# Patient Record
Sex: Male | Born: 1990 | Race: Black or African American | Hispanic: No | Marital: Single | Smoking: Never smoker
Health system: Southern US, Community
[De-identification: ages and names within clinical notes are randomized; demographics above are authoritative.]

## PROBLEM LIST (undated history)

## (undated) DIAGNOSIS — T7840XA Allergy, unspecified, initial encounter: Secondary | ICD-10-CM

## (undated) DIAGNOSIS — E119 Type 2 diabetes mellitus without complications: Secondary | ICD-10-CM

## (undated) HISTORY — DX: Allergy, unspecified, initial encounter: T78.40XA

## (undated) HISTORY — DX: Type 2 diabetes mellitus without complications: E11.9

---

## 2013-10-04 ENCOUNTER — Encounter (HOSPITAL_COMMUNITY): Payer: Self-pay | Admitting: Emergency Medicine

## 2013-10-04 ENCOUNTER — Emergency Department (HOSPITAL_COMMUNITY)
Admission: EM | Admit: 2013-10-04 | Discharge: 2013-10-04 | Disposition: A | Discharge status: Home / Self Care | Payer: Self-pay | Attending: Emergency Medicine | Admitting: Emergency Medicine

## 2013-10-04 ENCOUNTER — Emergency Department (HOSPITAL_COMMUNITY): Payer: Self-pay

## 2013-10-04 DIAGNOSIS — R071 Chest pain on breathing: Secondary | ICD-10-CM | POA: Insufficient documentation

## 2013-10-04 DIAGNOSIS — R059 Cough, unspecified: Secondary | ICD-10-CM | POA: Insufficient documentation

## 2013-10-04 DIAGNOSIS — R05 Cough: Secondary | ICD-10-CM | POA: Insufficient documentation

## 2013-10-04 DIAGNOSIS — J0110 Acute frontal sinusitis, unspecified: Secondary | ICD-10-CM | POA: Insufficient documentation

## 2013-10-04 DIAGNOSIS — E669 Obesity, unspecified: Secondary | ICD-10-CM | POA: Insufficient documentation

## 2013-10-04 DIAGNOSIS — J069 Acute upper respiratory infection, unspecified: Secondary | ICD-10-CM | POA: Insufficient documentation

## 2013-10-04 MED ORDER — AZITHROMYCIN 250 MG PO TABS: 250.0000 mg | ORAL_TABLET | Freq: Every day | ORAL | Status: DC

## 2013-10-04 MED ORDER — HYDROCODONE-HOMATROPINE 5-1.5 MG/5ML PO SYRP: 5.0000 mL | ORAL_SOLUTION | Freq: Four times a day (QID) | ORAL | Status: DC | PRN

## 2013-10-04 NOTE — ED Provider Notes (Signed)
CSN: [id number]     Arrival date & time 10/04/13  2232 History  This chart was scribed for non-physician practitioner, Santiago Glad, PA-C,working with Toy Cookey, MD, by Karle Plumber, ED Scribe. This patient was seen in room TR07C/TR07C and the patient's care was started at 11:27 PM.  Chief Complaint  Patient presents with  . Cough   Patient is a 23 y.o. male presenting with cough. The history is provided by the patient. No language interpreter was used.  Cough Associated symptoms: chest pain (chest wall pain secondary to cough)   Associated symptoms: no chills, no ear pain, no fever and no headaches    HPI Comments:  Joe Perez is a 23 y.o. obese male who presents to the Emergency Department complaining of a worsening cough over the past three months. Pt states he has had the cough intermittently for one year but states it is now stronger. He denies hemoptysis.  He reports associated chest wall pain secondary to the cough. He only has chest pain with repetitive coughing.  He states the cough is sometimes productive and sometime just a dry cough. He reports associated sinus pain and pressure and congestion. Pt reports taking Alka Seltzer, various OTC cough medications and various OTC allergy medications with no significant relief of the symptoms. There are no worsening factors reported. He denies fever, chills, HA or otalgia.  History reviewed. No pertinent past medical history. History reviewed. No pertinent past surgical history. No family history on file. History  Substance Use Topics  . Smoking status: Never Smoker   . Smokeless tobacco: Not on file  . Alcohol Use: No    Review of Systems  Constitutional: Negative for fever and chills.  HENT: Positive for congestion and sinus pressure. Negative for ear pain.   Respiratory: Positive for cough.   Cardiovascular: Positive for chest pain (chest wall pain secondary to cough).  Neurological: Negative for headaches.     Allergies  Review of patient's allergies indicates no known allergies.  Home Medications   Prior to Admission medications   Not on File   Triage Vitals: BP 132/65  Pulse 80  Temp(Src) 98.6 F (37 C)  Resp 18  Ht  (1.803 m)  Wt 275 lb (124.739 kg)  BMI 38.37 kg/m2  SpO2 97% Physical Exam  Nursing note and vitals reviewed. Constitutional: He is oriented to person, place, and time. He appears well-developed and well-nourished.  HENT:  Head: Normocephalic and atraumatic.  Nose: Right sinus exhibits frontal sinus tenderness. Right sinus exhibits no maxillary sinus tenderness. Left sinus exhibits frontal sinus tenderness. Left sinus exhibits no maxillary sinus tenderness.  Mouth/Throat: Uvula is midline, oropharynx is clear and moist and mucous membranes are normal. No oropharyngeal exudate, posterior oropharyngeal edema or posterior oropharyngeal erythema.  Eyes: EOM are normal.  Neck: Normal range of motion.  Cardiovascular: Normal rate, regular rhythm and normal heart sounds.  Exam reveals no gallop and no friction rub.   No murmur heard. Pulmonary/Chest: Effort normal and breath sounds normal. No respiratory distress. He has no wheezes. He has no rales.  Musculoskeletal: Normal range of motion.  Neurological: He is alert and oriented to person, place, and time.  Skin: Skin is warm and dry.  Psychiatric: He has a normal mood and affect. His behavior is normal.    ED Course  Procedures (including critical care time) DIAGNOSTIC STUDIES: Oxygen Saturation is 97% on RA, normal by my interpretation.   COORDINATION OF CARE: 11:32 PM- Will wait for CXR  to result. Pt verbalizes understanding and agrees to plan.  Medications - No data to display  Labs Review Labs Reviewed - No data to display  Imaging Review Dg Chest 2 View  10/04/2013   CLINICAL DATA:  Cough, congestion, shortness of breath, and chest pain and tightness.  EXAM: CHEST  2 VIEW  COMPARISON:  None.   FINDINGS: The lungs are well-aerated and clear. There is no evidence of focal opacification, pleural effusion or pneumothorax.  The heart is normal in size; the mediastinal contour is within normal limits. No acute osseous abnormalities are seen.  IMPRESSION: No acute cardiopulmonary process seen.   Electronically Signed   By: Roanna Raider M.D.   On: 10/04/2013 23:39     EKG Interpretation None      MDM   Final diagnoses:  None   Patient presenting with a cough x 3 months and also sinus pressure over the past 2-3 weeks.  Patient afebrile.  Non toxic appearing.  Lungs CTAB.  Pulse ox 97 on RA.  CXR negative.  Patient discharged home with antibiotic to treat for Acute Sinusitis.  Patient also given Rx for cough suppressant and instructed to use decongestant.  Patient stable for discharge.  Return precautions given.  I personally performed the services described in this documentation, which was scribed in my presence. The recorded information has been reviewed and is accurate.   Santiago Glad, PA-C 10/05/13 0127

## 2013-10-04 NOTE — Discharge Instructions (Signed)

## 2013-10-04 NOTE — ED Notes (Signed)
Chest congestion hurts when he coughs

## 2013-10-04 NOTE — ED Notes (Signed)
The pt has had a cough for one year intermittently.  For the past 3 months it has been worse.  He sounds as if he has a cold now.  No distress

## 2013-10-05 NOTE — ED Provider Notes (Signed)
Medical screening examination/treatment/procedure(s) were performed by non-physician practitioner and as supervising physician I was immediately available for consultation/collaboration.  Toy Cookey, MD 10/05/13 1009

## 2014-06-01 ENCOUNTER — Emergency Department (HOSPITAL_BASED_OUTPATIENT_CLINIC_OR_DEPARTMENT_OTHER)
Admission: EM | Admit: 2014-06-01 | Discharge: 2014-06-01 | Disposition: A | Discharge status: Home / Self Care | Payer: Self-pay | Attending: Emergency Medicine | Admitting: Emergency Medicine

## 2014-06-01 ENCOUNTER — Encounter (HOSPITAL_BASED_OUTPATIENT_CLINIC_OR_DEPARTMENT_OTHER): Payer: Self-pay | Admitting: Emergency Medicine

## 2014-06-01 DIAGNOSIS — R197 Diarrhea, unspecified: Secondary | ICD-10-CM | POA: Insufficient documentation

## 2014-06-01 DIAGNOSIS — R109 Unspecified abdominal pain: Secondary | ICD-10-CM

## 2014-06-01 DIAGNOSIS — R1013 Epigastric pain: Secondary | ICD-10-CM | POA: Insufficient documentation

## 2014-06-01 LAB — CBC WITH DIFFERENTIAL/PLATELET
Basophils Absolute: 0 10*3/uL (ref 0.0–0.1)
Basophils Relative: 0 % (ref 0–1)
Eosinophils Absolute: 0.2 10*3/uL (ref 0.0–0.7)
Eosinophils Relative: 2 % (ref 0–5)
HCT: 40.5 % (ref 39.0–52.0)
Hemoglobin: 13.4 g/dL (ref 13.0–17.0)
Lymphocytes Relative: 21 % (ref 12–46)
Lymphs Abs: 1.7 10*3/uL (ref 0.7–4.0)
MCH: 27.6 pg (ref 26.0–34.0)
MCHC: 33.1 g/dL (ref 30.0–36.0)
MCV: 83.5 fL (ref 78.0–100.0)
Monocytes Absolute: 1.6 10*3/uL — ABNORMAL HIGH (ref 0.1–1.0)
Monocytes Relative: 19 % — ABNORMAL HIGH (ref 3–12)
Neutro Abs: 4.7 10*3/uL (ref 1.7–7.7)
Neutrophils Relative %: 58 % (ref 43–77)
Platelets: 266 10*3/uL (ref 150–400)
RBC: 4.85 MIL/uL (ref 4.22–5.81)
RDW: 12.6 % (ref 11.5–15.5)
WBC: 8.2 10*3/uL (ref 4.0–10.5)

## 2014-06-01 LAB — BASIC METABOLIC PANEL
Anion gap: 11 (ref 5–15)
BUN: 8 mg/dL (ref 6–20)
CO2: 26 mmol/L (ref 22–32)
Calcium: 9.3 mg/dL (ref 8.9–10.3)
Chloride: 99 mmol/L — ABNORMAL LOW (ref 101–111)
Creatinine, Ser: 1.04 mg/dL (ref 0.61–1.24)
GFR calc Af Amer: >60 mL/min (ref >60)
GFR calc non Af Amer: >60 mL/min (ref >60)
Glucose, Bld: 205 mg/dL — ABNORMAL HIGH (ref 65–99)
Potassium: 3.6 mmol/L (ref 3.5–5.1)
Sodium: 136 mmol/L (ref 135–145)

## 2014-06-01 LAB — LIPASE, BLOOD: Lipase: 24 U/L (ref 22–51)

## 2014-06-01 MED ORDER — LOPERAMIDE HCL 2 MG PO CAPS: 2.0000 mg | ORAL_CAPSULE | Freq: Four times a day (QID) | ORAL | Status: DC | PRN

## 2014-06-01 MED ORDER — GI COCKTAIL ~~LOC~~
30.0000 mL | Freq: Once | ORAL | Status: AC
  Administered 2014-06-01: 30 mL via ORAL
  Filled 2014-06-01: qty 30

## 2014-06-01 MED ORDER — SODIUM CHLORIDE 0.9 % IV BOLUS (SEPSIS)
1000.0000 mL | Freq: Once | INTRAVENOUS | Status: AC
  Administered 2014-06-01: 1000 mL via INTRAVENOUS

## 2014-06-01 MED ORDER — OMEPRAZOLE 20 MG PO CPDR: 20.0000 mg | DELAYED_RELEASE_CAPSULE | Freq: Every day | ORAL | Status: DC

## 2014-06-01 NOTE — ED Notes (Signed)
Pt able to drink water with no n/v. Renette ButtersGolden, Viona Gilmorehristy Lee, RN

## 2014-06-01 NOTE — ED Provider Notes (Signed)
CSN: [id number]     Arrival date & time 06/01/14  1947 History   First MD Initiated Contact with Patient 06/01/14 2005     Chief Complaint  Patient presents with  . Abdominal Cramping  . Cough     (Consider location/radiation/quality/duration/timing/severity/associated sxs/prior Treatment) HPI De BlanchDerrick Perez is a 24 y.o. male who comes in for evaluation of abdominal cramping. Patient states for the past 4 days or so he has had intermittent abdominal cramping in his upper abdomen. He reports associated diarrhea, nonbloody. Reports associated fevers on Thursday to have since resolved. He has tried ice chips with some relief. Denies nausea or vomiting. Denies any new foods, medications, antibiotics, recent travel. Denies any overt abdominal pain, urinary symptoms, scrotal pain, penile discharge. No previous abdominal surgeries.  History reviewed. No pertinent past medical history. History reviewed. No pertinent past surgical history. History reviewed. No pertinent family history. History  Substance Use Topics  . Smoking status: Never Smoker   . Smokeless tobacco: Not on file  . Alcohol Use: No    Review of Systems A 10 point review of systems was completed and was negative except for pertinent positives and negatives as mentioned in the history of present illness     Allergies  Review of patient's allergies indicates no known allergies.  Home Medications   Prior to Admission medications   Medication Sig Start Date End Date Taking? Authorizing Provider  loperamide (IMODIUM) 2 MG capsule Take 1 capsule (2 mg total) by mouth 4 (four) times daily as needed for diarrhea or loose stools. 06/01/14   Joycie PeekBenjamin Violeta Lecount, PA-C  omeprazole (PRILOSEC) 20 MG capsule Take 1 capsule (20 mg total) by mouth daily. 06/01/14   Zayanna Pundt, PA-C   BP 131/79 mmHg  Pulse 77  Temp(Src) 98.2 F (36.8 C) (Oral)  Resp 18  Ht 5\' 11"  (1.803 m)  Wt 250 lb (113.399 kg)  BMI 34.88 kg/m2  SpO2  100% Physical Exam  Constitutional: He is oriented to person, place, and time. He appears well-developed and well-nourished.  HENT:  Head: Normocephalic and atraumatic.  Mouth/Throat: Oropharynx is clear and moist. No oropharyngeal exudate.  Eyes: Conjunctivae are normal. Pupils are equal, round, and reactive to light. Right eye exhibits no discharge. Left eye exhibits no discharge. No scleral icterus.  Neck: Neck supple.  Cardiovascular: Normal rate, regular rhythm and normal heart sounds.   Pulmonary/Chest: Effort normal and breath sounds normal. No respiratory distress. He has no wheezes. He has no rales.  Abdominal: Soft.  Diffuse abdominal tenderness in the upper abdomen and somewhat focally over the epigastrium. Abdomen is otherwise soft, nondistended without rebound or guarding. Negative Murphy's, McBurney's. No lesions, masses or deformities  Musculoskeletal: He exhibits no tenderness.  Neurological: He is alert and oriented to person, place, and time.  Cranial Nerves II-XII grossly intact  Skin: Skin is warm and dry. No rash noted.  Psychiatric: He has a normal mood and affect.  Nursing note and vitals reviewed.   ED Course  Procedures (including critical care time) Labs Review Labs Reviewed  BASIC METABOLIC PANEL - Abnormal; Notable for the following:    Chloride 99 (*)    Glucose, Bld 205 (*)    All other components within normal limits  CBC WITH DIFFERENTIAL/PLATELET - Abnormal; Notable for the following:    Monocytes Relative 19 (*)    Monocytes Absolute 1.6 (*)    All other components within normal limits  LIPASE, BLOOD    Imaging Review No results found.  EKG Interpretation None     Meds given in ED:  Medications  gi cocktail (Maalox,Lidocaine,Donnatal) (30 mLs Oral Given 06/01/14 2046)  sodium chloride 0.9 % bolus 1,000 mL (0 mLs Intravenous Stopped 06/01/14 2141)    Discharge Medication List as of 06/01/2014 10:37 PM    START taking these medications    Details  loperamide (IMODIUM) 2 MG capsule Take 1 capsule (2 mg total) by mouth 4 (four) times daily as needed for diarrhea or loose stools., Starting 06/01/2014, Until Discontinued, Print    omeprazole (PRILOSEC) 20 MG capsule Take 1 capsule (20 mg total) by mouth daily., Starting 06/01/2014, Until Discontinued, Print       Filed Vitals:   06/01/14 1956 06/01/14 2141 06/01/14 2244  BP: 137/75 137/65 131/79  Pulse: 85 81 77  Temp: 98.2 F (36.8 C)    TempSrc: Oral    Resp: 20 18 18   Height: 5\' 11"  (1.803 m)    Weight: 250 lb (113.399 kg)    SpO2: 99% 98% 100%    MDM  Vitals stable - WNL -afebrile Pt resting comfortably in ED. reports medications administered in ED improved discomfort. Reports that he did feel some mild cramping/discomfort but this was relieved when he had a bowel movement. PE--abdominal exam not concerning. Labwork-labs noncontributory  Patient with abdominal discomfort relieved by a bowel movement. Consider gastroenteritis versus IBS. Doubt any infectious cause or other acute intra-abdominal pathology. Doubt appendicitis. Will DC with loperamide, instructions for rehydration and follow up with primary care for further evaluation and management of symptoms.  I discussed all relevant lab findings and imaging results with pt and they verbalized understanding. Discussed f/u with PCP within 48 hrs and return precautions, pt very amenable to plan.  Final diagnoses:  Abdominal discomfort  Diarrhea      Joycie PeekBenjamin Arnoldo Hildreth, PA-C 06/02/14 0009  Jerelyn ScottMartha Linker, MD 06/03/14 [phone]

## 2014-06-01 NOTE — ED Notes (Signed)
Pt in c/o intermittent nausea and abdominal cramping, sometimes resulting in diarrhea. Also states nonproductive cough. Denies abdominal pain at this time.

## 2014-06-01 NOTE — ED Notes (Signed)
Pt tolerating PO fluids well at this time, denies n/v/d.

## 2014-06-01 NOTE — Discharge Instructions (Signed)
You were evaluated in the ED today for your abdominal discomfort. There does not appear to be an emergent cause her symptoms at this time. Please take your medications as directed. Please follow-up with primary care for further evaluation and management of your symptoms. Return to ED for new or worsening symptoms  Diarrhea Diarrhea is frequent loose and watery bowel movements. It can cause you to feel weak and dehydrated. Dehydration can cause you to become tired and thirsty, have a dry mouth, and have decreased urination that often is dark yellow. Diarrhea is a sign of another problem, most often an infection that will not last long. In most cases, diarrhea typically lasts 2-3 days. However, it can last longer if it is a sign of something more serious. It is important to treat your diarrhea as directed by your caregiver to lessen or prevent future episodes of diarrhea. CAUSES  Some common causes include:  Gastrointestinal infections caused by viruses, bacteria, or parasites.  Food poisoning or food allergies.  Certain medicines, such as antibiotics, chemotherapy, and laxatives.  Artificial sweeteners and fructose.  Digestive disorders. HOME CARE INSTRUCTIONS  Ensure adequate fluid intake (hydration): Have 1 cup (8 oz) of fluid for each diarrhea episode. Avoid fluids that contain simple sugars or sports drinks, fruit juices, whole milk products, and sodas. Your urine should be clear or pale yellow if you are drinking enough fluids. Hydrate with an oral rehydration solution that you can purchase at pharmacies, retail stores, and online. You can prepare an oral rehydration solution at home by mixing the following ingredients together:   - tsp table salt.   tsp baking soda.   tsp salt substitute containing potassium chloride.  1  tablespoons sugar.  1 L (34 oz) of water.  Certain foods and beverages may increase the speed at which food moves through the gastrointestinal (GI) tract. These  foods and beverages should be avoided and include:  Caffeinated and alcoholic beverages.  High-fiber foods, such as raw fruits and vegetables, nuts, seeds, and whole grain breads and cereals.  Foods and beverages sweetened with sugar alcohols, such as xylitol, sorbitol, and mannitol.  Some foods may be well tolerated and may help thicken stool including:  Starchy foods, such as rice, toast, pasta, low-sugar cereal, oatmeal, grits, baked potatoes, crackers, and bagels.  Bananas.  Applesauce.  Add probiotic-rich foods to help increase healthy bacteria in the GI tract, such as yogurt and fermented milk products.  Wash your hands well after each diarrhea episode.  Only take over-the-counter or prescription medicines as directed by your caregiver.  Take a warm bath to relieve any burning or pain from frequent diarrhea episodes. SEEK IMMEDIATE MEDICAL CARE IF:   You are unable to keep fluids down.  You have persistent vomiting.  You have blood in your stool, or your stools are black and tarry.  You do not urinate in 6-8 hours, or there is only a small amount of very dark urine.  You have abdominal pain that increases or localizes.  You have weakness, dizziness, confusion, or light-headedness.  You have a severe headache.  Your diarrhea gets worse or does not get better.  You have a fever or persistent symptoms for more than 2-3 days.  You have a fever and your symptoms suddenly get worse. MAKE SURE YOU:   Understand these instructions.  Will watch your condition.  Will get help right away if you are not doing well or get worse. Document Released: 12/24/2001 Document Revised: 05/20/2013 Document Reviewed:  09/11/2011 ExitCare Patient Information 2015 [place] [place]. This information is not intended to replace advice given to you by your health care provider. Make sure you discuss any questions you have with your health care provider.  Rotavirus Infection Rotaviruses  are a group of viruses that cause acute stomach and bowel upset (gastroenteritis) in all ages. Rotavirus infection may also be called infantile diarrhea, winter diarrhea, acute nonbacterial infectious gastroenteritis, and acute viral gastroenteritis. It occurs especially in young children. Children 6 months to 672 years of age, premature infants, the elderly, and the immunocompromised are more likely to have severe symptoms.  CAUSES  Rotaviruses are transmitted by the fecal-oral route. This means the virus is spread by eating or drinking food or water that is contaminated with infected stool. The virus is most commonly spread from person to person when someone's hands are contaminated with infected stool. For example, infected food handlers may contaminate foods. This can occur with foods that require handling and no further cooking, such as salads, fruits, and hors d'oeuvres. Rotaviruses are quite stable. They can be hard to control and eliminate in water supplies. Rotaviruses are a common cause of infection and diarrhea in child-care settings. SYMPTOMS  Some children have no symptoms. The period after infection but before symptoms begin (incubation period) ranges from 1 to 3 days. Symptoms usually begin with vomiting. Diarrhea follows for 4 to 8 days. Other symptoms may include:  Low-grade fever.  Temporary dairy (lactose) intolerance.  Cough.  Runny nose. DIAGNOSIS  The disease is diagnosed by identifying the virus in the stool. A person with rotavirus diarrhea often has large numbers of viruses in his or her stool. TREATMENT  There is no cure for rotavirus infection. Most people develop an immune response that eventually gets rid of the virus. While this natural response develops, the virus can make you very ill. The majority of people affected are young infants, so the disease can be dangerous. The most common symptom is diarrhea. Diarrhea alone can cause severe dehydration. It can also cause an  electrolyte imbalance. Treatments are aimed at rehydration. Rehydration treatment can prevent the severe effects of dehydration. Antidiarrheal medicines are not recommended. Such medicines may prolong the infection, since they prevent you from passing the viruses out of your body. Severe diarrhea without fluid and electrolyte replacement may be life threatening. HOME CARE INSTRUCTIONS Ask your health care provider for specific rehydration instructions. SEEK IMMEDIATE MEDICAL CARE IF:   There is decreased urination.  You have a dry mouth, tongue, or lips.  You notice decreased tears or sunken eyes.  You have dry skin.  Your breathing is fast.  Your fingertip takes more than 2 seconds to turn pink again after a gentle squeeze.  There is blood in your vomit or stool.  Your abdomen is enlarged (distended) or very tender.  There is persistent vomiting. Most of this information is courtesy of the Center for Disease Control and Prevention of Food Illness Fact Sheet. Document Released: 01/03/2005 Document Revised: 05/20/2013 Document Reviewed: 04/01/2010 [facility] Patient Information 2015 [place], [place]. This information is not intended to replace advice given to you by your health care provider. Make sure you discuss any questions you have with your health care provider.

## 2014-07-02 ENCOUNTER — Telehealth: Payer: Self-pay | Admitting: Medical

## 2014-07-02 ENCOUNTER — Encounter: Payer: Self-pay | Admitting: Medical

## 2014-07-02 ENCOUNTER — Ambulatory Visit (INDEPENDENT_AMBULATORY_CARE_PROVIDER_SITE_OTHER): Payer: Managed Care, Other (non HMO) | Admitting: Medical

## 2014-07-02 VITALS — BP 137/70 | HR 75 | Temp 98.0°F | Ht 71.0 in | Wt 265.6 lb

## 2014-07-02 DIAGNOSIS — R739 Hyperglycemia, unspecified: Secondary | ICD-10-CM | POA: Insufficient documentation

## 2014-07-02 DIAGNOSIS — J301 Allergic rhinitis due to pollen: Secondary | ICD-10-CM | POA: Diagnosis not present

## 2014-07-02 DIAGNOSIS — E119 Type 2 diabetes mellitus without complications: Secondary | ICD-10-CM

## 2014-07-02 DIAGNOSIS — J309 Allergic rhinitis, unspecified: Secondary | ICD-10-CM | POA: Insufficient documentation

## 2014-07-02 HISTORY — DX: Type 2 diabetes mellitus without complications: E11.9

## 2014-07-02 LAB — GLUCOSE, POCT (MANUAL RESULT ENTRY): POC Glucose: 478 mg/dl — AB (ref 70–99)

## 2014-07-02 MED ORDER — METFORMIN HCL 500 MG PO TABS: 500.0000 mg | ORAL_TABLET | Freq: Two times a day (BID) | ORAL | Status: AC

## 2014-07-02 MED ORDER — GLUCOSE BLOOD VI STRP: ORAL_STRIP | Status: DC

## 2014-07-02 MED ORDER — GLYBURIDE 5 MG PO TABS: 5.0000 mg | ORAL_TABLET | Freq: Every day | ORAL | Status: DC

## 2014-07-02 MED ORDER — RELION LANCETS MICRO-THIN 33G MISC: Status: DC

## 2014-07-02 MED ORDER — RELION CONFIRM GLUCOSE MONITOR W/DEVICE KIT: PACK | Status: AC

## 2014-07-02 NOTE — Assessment & Plan Note (Signed)
fsbs in office and will get cmp and a1-c.

## 2014-07-02 NOTE — Assessment & Plan Note (Signed)
For seasonal allergies recommend claritin and flonase otc.

## 2014-07-02 NOTE — Patient Instructions (Addendum)
Allergic rhinitis For seasonal allergies recommend claritin and flonase otc.  Hyperglycemia fsbs in office and will get cmp and a1-c.   Diabetes Based on bs of 478. I will get stat cbc, a1c and cmp. Advised pt to strictly just drink water today. And if he does eat only protein. No carbohydrates at all. I want him to start diabetic meds today. Check bs at 3 pm this afternoon and give me a call with value. Will get ua and see if ketones showing. If pt becomes lethargic,  fatigue, chills, shake, dizzy etc then ED evaluation. Follow up tommorow at 8 am for visit and bs check.   Explained importance of follow up.    Follow up tomorrow 8 am or as needed.  Pt can get meds today. But he is not sure if can get glucometer and supplies.  So we gave one touch with supples. So he could check his bs.  Pt given work note today and his girlfriend made aware to watch him and take to ED if above occurs.

## 2014-07-02 NOTE — Progress Notes (Signed)
Subjective:    Patient ID: Joe Perez, male    DOB: 09-Feb-1990, 24 y.o.   MRN: [id number]  HPI   I have reviewed pt PMH, PSH, FH, Social History and Surgical History  Allergies- seasonal allergies. Spring is worst season.  Pt  1 month ago had ED visit gastroenteris vs ibs. But that was only one incident per pt. Then he got better within 3-4 days. Not on any anti diarrhea med any more.  Hyperglycemia in ED. BS was 205. Pt mentions has to urinate a lot past 2 wks. Also always thirst. Not hungry all the time. 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 Drinks 3-4 sodas. Not diet.      Review of Systems  Constitutional: Negative for chills, diaphoresis and fatigue.  HENT: Negative for dental problem, ear pain, hearing loss and postnasal drip.   Respiratory: Negative for chest tightness, shortness of breath and wheezing.   Cardiovascular: Negative for chest pain and palpitations.  Gastrointestinal: Negative for nausea, vomiting, abdominal pain, constipation and blood in stool.  Endocrine: Positive for polydipsia and polyuria.  Genitourinary: Negative for dysuria, frequency, hematuria, flank pain, decreased urine volume, discharge, enuresis and testicular pain.  Musculoskeletal: Negative for back pain and gait problem.  Neurological: Negative for dizziness, seizures, speech difficulty, weakness, numbness and headaches.  Hematological: Negative for adenopathy. Does not bruise/bleed easily.  Psychiatric/Behavioral: Negative for behavioral problems and confusion.    Past Medical History  Diagnosis Date  . Allergy     History   Social History  . Marital Status: Single    Spouse Name: N/A  . Number of Children: N/A  . Years of Education: N/A   Occupational History  . Not on file.   Social History Main Topics  . Smoking status: Never Smoker   . Smokeless tobacco: Never Used  . Alcohol Use: 0.0 oz/week    0 Standard drinks or equivalent per  week     Comment: Occasional social drinker. 3-4 beers a month.  . Drug Use: No  . Sexual Activity: Yes   Other Topics Concern  . Not on file   Social History Narrative    No past surgical history on file.  Family History  Problem Relation Age of Onset  . Asthma Mother   . Kidney failure Father     No Known Allergies  Current Outpatient Prescriptions on File Prior to Visit  Medication Sig Dispense Refill  . loperamide (IMODIUM) 2 MG capsule Take 1 capsule (2 mg total) by mouth 4 (four) times daily as needed for diarrhea or loose stools. (Patient not taking: Reported on 07/02/2014) 12 capsule 0  . omeprazole (PRILOSEC) 20 MG capsule Take 1 capsule (20 mg total) by mouth daily. (Patient not taking: Reported on 07/02/2014) 20 capsule 0   No current facility-administered medications on file prior to visit.    BP 137/70 mmHg  Pulse 75  Temp(Src) 98 F (36.7 C) (Oral)  Ht 5\' 11"  (1.803 m)  Wt 265 lb 9.6 oz (120.475 kg)  BMI 37.06 kg/m2  SpO2 97%      Objective:   Physical Exam  General- No acute distress. Pleasant patient.  Neck- Full range of motion, no jvd Lungs- Clear, even and unlabored. Heart- regular rate and rhythm. Neurologic- CNII- XII grossly intact.  Abdomen Inspection:-Inspection Normal.  Palpation/Perucssion: Palpation and Percussion of the abdomen reveal- Non Tender, No Rebound tenderness, No rigidity(Guarding) and No Palpable abdominal masses.  Liver:-Normal.  Spleen:- Normal.  Back- no cva tenderness.  Skin- moist.      Assessment & Plan:

## 2014-07-02 NOTE — Progress Notes (Signed)
Pre visit review using our clinic review tool, if applicable. No additional management support is needed unless otherwise documented below in the visit note. 

## 2014-07-02 NOTE — Telephone Encounter (Signed)
Called pt a second time and on answer.

## 2014-07-02 NOTE — Telephone Encounter (Signed)
I asked pt to call me at 3 pm and give me a bs reading. Instead staff just notified me that he called. Got message after hours. So I called leaving message telling him that I want him to continue to hydrate just water. No carbohydrates. Can eat protein. He should have started his diabetic  medication. If he checked bs and higher than it was in office then advised him to go to ED now. If his blood sugars are less and he feels asymptomatic then will see him tomorrow at 8 am.

## 2014-07-02 NOTE — Telephone Encounter (Signed)
Pt states PA wanted pt to call him at 3pm best #[phone]

## 2014-07-02 NOTE — Assessment & Plan Note (Signed)
Based on bs of 478. I will get stat cbc, a1c and cmp. Advised pt to strictly just drink water today. And if he does eat only protein. No carbohydrates at all. I want him to start diabetic meds today. Check bs at 3 pm this afternoon and give me a call with value. Will get ua and see if ketones showing. If pt becomes lethargic,  fatigue, chills, shake, dizzy etc then ED evaluation. Follow up tommorow at 8 am for visit and bs check.   Explained importance of follow up.

## 2014-07-03 ENCOUNTER — Encounter: Payer: Self-pay | Admitting: Medical

## 2014-07-03 ENCOUNTER — Ambulatory Visit (INDEPENDENT_AMBULATORY_CARE_PROVIDER_SITE_OTHER): Payer: Managed Care, Other (non HMO) | Admitting: Medical

## 2014-07-03 VITALS — BP 141/70 | HR 70 | Temp 98.3°F | Ht 71.0 in | Wt 266.4 lb

## 2014-07-03 DIAGNOSIS — E119 Type 2 diabetes mellitus without complications: Secondary | ICD-10-CM

## 2014-07-03 LAB — CBC WITH DIFFERENTIAL/PLATELET
BASOS PCT: 0.5 % (ref 0.0–3.0)
Basophils Absolute: 0 10*3/uL (ref 0.0–0.1)
EOS ABS: 0.4 10*3/uL (ref 0.0–0.7)
EOS PCT: 8.6 % — AB (ref 0.0–5.0)
HCT: 39.4 % (ref 39.0–52.0)
Hemoglobin: 13.2 g/dL (ref 13.0–17.0)
LYMPHS ABS: 1.8 10*3/uL (ref 0.7–4.0)
Lymphocytes Relative: 35.3 % (ref 12.0–46.0)
MCHC: 33.4 g/dL (ref 30.0–36.0)
MCV: 83.3 fl (ref 78.0–100.0)
Monocytes Absolute: 0.5 10*3/uL (ref 0.1–1.0)
Monocytes Relative: 9.8 % (ref 3.0–12.0)
NEUTROS ABS: 2.4 10*3/uL (ref 1.4–7.7)
Neutrophils Relative %: 45.8 % (ref 43.0–77.0)
Platelets: 300 10*3/uL (ref 150.0–400.0)
RBC: 4.73 Mil/uL (ref 4.22–5.81)
RDW: 14 % (ref 11.5–15.5)
WBC: 5.2 10*3/uL (ref 4.0–10.5)

## 2014-07-03 LAB — COMPREHENSIVE METABOLIC PANEL
ALBUMIN: 4 g/dL (ref 3.5–5.2)
ALK PHOS: 96 U/L (ref 39–117)
ALT: 43 U/L (ref 0–53)
AST: 27 U/L (ref 0–37)
BUN: 12 mg/dL (ref 6–23)
CO2: 26 mEq/L (ref 19–32)
Calcium: 9.2 mg/dL (ref 8.4–10.5)
Chloride: 98 mEq/L (ref 96–112)
Creatinine, Ser: 0.96 mg/dL (ref 0.40–1.50)
GFR: 123.86 mL/min (ref >60.00)
Glucose, Bld: 313 mg/dL — ABNORMAL HIGH (ref 70–99)
POTASSIUM: 3.8 meq/L (ref 3.5–5.1)
Sodium: 130 mEq/L — ABNORMAL LOW (ref 135–145)
TOTAL PROTEIN: 7.7 g/dL (ref 6.0–8.3)
Total Bilirubin: 0.5 mg/dL (ref 0.2–1.2)

## 2014-07-03 LAB — HEMOGLOBIN A1C: Hgb A1c MFr Bld: 10 % — ABNORMAL HIGH (ref 4.6–6.5)

## 2014-07-03 NOTE — Patient Instructions (Addendum)
Diabetes Pt started meds and eating better diet. Want him to continue new meds. Start exercise program and lose weight. Check bs fasting. After meals as well to get idea on impact of foods he is eating. Document readings. Investigate low glycemic index foods.      Follow up in 2 wks or as needed. If bs increasing as yesterday notify us. Explained if over 400 and symptomatic then go to ED.  On follow up is bp borderline as today consider low dose ace inhibitor.  Pt did not get cmp and a1-c yesterday. So I asked him to go to lab today to get.

## 2014-07-03 NOTE — Progress Notes (Signed)
Pre visit review using our clinic review tool, if applicable. No additional management support is needed unless otherwise documented below in the visit note. 

## 2014-07-03 NOTE — Progress Notes (Signed)
Subjective:    Patient ID: Joe Perez, male    DOB: 01/24/1990, 24 y.o.   MRN: [id number]  HPI  Pt blood sugar last night after dinner bs was 317. This morning fast 337. Pt feels good. No nausea, no vomiting, no dizziness, no chills and no tremors. No dry mouth. Pt did get metformin and diabeta. He has machine at home. Pt has done some investigating of low bs foods. He is not eating carbohydrates. Stopped sodas already.   Review of Systems  Constitutional: Negative for fever, chills and fatigue.  HENT: Negative.   Respiratory: Negative for cough, chest tightness, shortness of breath and wheezing.   Cardiovascular: Negative for chest pain and palpitations.  Endocrine: Negative for polydipsia, polyphagia and polyuria.       He is urinating less now.  Genitourinary: Negative.   Musculoskeletal: Negative for back pain.  Skin: Negative for rash.  Neurological: Negative for dizziness, syncope, weakness, numbness and headaches.  Hematological: Negative for adenopathy. Does not bruise/bleed easily.  Psychiatric/Behavioral: Negative for behavioral problems.    Past Medical History  Diagnosis Date  . Allergy   . Diabetes 07/02/2014    History   Social History  . Marital Status: Single    Spouse Name: N/A  . Number of Children: N/A  . Years of Education: N/A   Occupational History  . Not on file.   Social History Main Topics  . Smoking status: Never Smoker   . Smokeless tobacco: Never Used  . Alcohol Use: 0.0 oz/week    0 Standard drinks or equivalent per week     Comment: Occasional social drinker. 3-4 beers a month.  . Drug Use: No  . Sexual Activity: Yes   Other Topics Concern  . Not on file   Social History Narrative    No past surgical history on file.  Family History  Problem Relation Age of Onset  . Asthma Mother   . Kidney failure Father     No Known Allergies  Current Outpatient Prescriptions on File Prior to Visit  Medication Sig Dispense Refill   . Blood Glucose Monitoring Suppl (RELION CONFIRM GLUCOSE MONITOR) W/DEVICE KIT Check bs in am fasting or if symptomatic. 1 kit 0  . glucose blood (RELION PRIME TEST) test strip Use as instructed 100 each 6  . glyBURIDE (DIABETA) 5 MG tablet Take 1 tablet (5 mg total) by mouth daily with breakfast. 30 tablet 3  . metFORMIN (GLUCOPHAGE) 500 MG tablet Take 1 tablet (500 mg total) by mouth 2 (two) times daily with a meal. 60 tablet 3  . RELION LANCETS MICRO-THIN 33G MISC Check bs am fasting and later in day if symptomatic 100 each 6   No current facility-administered medications on file prior to visit.    BP 141/70 mmHg  Pulse 70  Temp(Src) 98.3 F (36.8 C) (Oral)  Ht 5' 11"  (1.803 m)  Wt 266 lb 6.4 oz (120.838 kg)  BMI 37.17 kg/m2  SpO2 96%   Past Medical History  Diagnosis Date  . Allergy   . Diabetes 07/02/2014    History   Social History  . Marital Status: Single    Spouse Name: N/A  . Number of Children: N/A  . Years of Education: N/A   Occupational History  . Not on file.   Social History Main Topics  . Smoking status: Never Smoker   . Smokeless tobacco: Never Used  . Alcohol Use: 0.0 oz/week    0 Standard drinks or equivalent  per week     Comment: Occasional social drinker. 3-4 beers a month.  . Drug Use: No  . Sexual Activity: Yes   Other Topics Concern  . Not on file   Social History Narrative    No past surgical history on file.  Family History  Problem Relation Age of Onset  . Asthma Mother   . Kidney failure Father     No Known Allergies  Current Outpatient Prescriptions on File Prior to Visit  Medication Sig Dispense Refill  . Blood Glucose Monitoring Suppl (RELION CONFIRM GLUCOSE MONITOR) W/DEVICE KIT Check bs in am fasting or if symptomatic. 1 kit 0  . glucose blood (RELION PRIME TEST) test strip Use as instructed 100 each 6  . glyBURIDE (DIABETA) 5 MG tablet Take 1 tablet (5 mg total) by mouth daily with breakfast. 30 tablet 3  .  metFORMIN (GLUCOPHAGE) 500 MG tablet Take 1 tablet (500 mg total) by mouth 2 (two) times daily with a meal. 60 tablet 3  . RELION LANCETS MICRO-THIN 33G MISC Check bs am fasting and later in day if symptomatic 100 each 6   No current facility-administered medications on file prior to visit.    BP 141/70 mmHg  Pulse 70  Temp(Src) 98.3 F (36.8 C) (Oral)  Ht 5' 11"  (1.803 m)  Wt 266 lb 6.4 oz (120.838 kg)  BMI 37.17 kg/m2  SpO2 96%        Objective:   Physical Exam  General Mental Status- Alert. General Appearance- Not in acute distress.   Skin General: Color- Normal Color. Moisture- Normal Moisture.  Neck Carotid Arteries- Normal color. Moisture- Normal Moisture. No carotid bruits. No JVD.  Chest and Lung Exam Auscultation: Breath Sounds:-Normal.  Cardiovascular Auscultation:Rythm- Regular. Murmurs & Other Heart Sounds:Auscultation of the heart reveals- No Murmurs.  Abdomen Inspection:-Inspeection Normal. Palpation/Percussion:Note:No mass. Palpation and Percussion of the abdomen reveal- Non Tender, Non Distended + BS, no rebound or guarding.    Neurologic Cranial Nerve exam:- CN III-XII intact(No nystagmus), symmetric smile. Strength:- 5/5 equal and symmetric strength both upper and lower extremities.      Assessment & Plan:

## 2014-07-03 NOTE — Assessment & Plan Note (Addendum)
Pt started meds and eating better diet. Want him to continue new meds. Start exercise program and lose weight. Check bs fasting. After meals as well to get idea on impact of foods he is eating. Document readings. Investigate low glycemic index foods.  Yesterday bs was 478 fasting this am fasting 337.

## 2014-07-04 ENCOUNTER — Telehealth: Payer: Self-pay | Admitting: Medical

## 2014-07-04 MED ORDER — ONETOUCH DELICA LANCETS FINE MISC: Status: AC

## 2014-07-04 MED ORDER — GLUCOSE BLOOD VI STRP: ORAL_STRIP | Status: DC

## 2014-07-04 NOTE — Addendum Note (Signed)
Addended by: Marylouise Stacks E on: 07/04/2014 12:03 PM   Modules accepted: Orders, Medications

## 2014-07-04 NOTE — Telephone Encounter (Signed)
Patient calling regarding the one-touch machine he was given at visit and question about needles - the needles he has doesn't fit [phone]

## 2014-07-13 ENCOUNTER — Telehealth: Payer: Self-pay | Admitting: Medical

## 2014-07-13 MED ORDER — GLYBURIDE 5 MG PO TABS: 10.0000 mg | ORAL_TABLET | Freq: Every day | ORAL | Status: DC

## 2014-07-13 NOTE — Telephone Encounter (Signed)
Was going to increase his metformin then realized that he was already on max dose. Appears not to have insurance. So cost of med may be a factor. So will increase the diabeta. And caution on possible hypoglycemic side effects.

## 2014-07-17 ENCOUNTER — Ambulatory Visit: Payer: Self-pay | Admitting: Medical

## 2014-07-24 ENCOUNTER — Encounter: Payer: Managed Care, Other (non HMO) | Admitting: Medical

## 2014-07-24 NOTE — Progress Notes (Signed)
This encounter was created in error - please disregard.

## 2014-07-25 ENCOUNTER — Telehealth: Payer: Self-pay | Admitting: Medical

## 2014-07-25 NOTE — Telephone Encounter (Signed)
Pt was no show 07/24/14 9:00am, follow up 15 appt, patient left message on VM on 7/7 stating that he has had a family emergency and needs to reschedule, pt has rescheduled for 07/31/14, charge no show?

## 2014-07-25 NOTE — Telephone Encounter (Signed)
No charge.But can you call and ask to reschedule.

## 2014-07-31 ENCOUNTER — Ambulatory Visit (INDEPENDENT_AMBULATORY_CARE_PROVIDER_SITE_OTHER): Payer: Managed Care, Other (non HMO) | Admitting: Medical

## 2014-07-31 ENCOUNTER — Encounter: Payer: Self-pay | Admitting: Medical

## 2014-07-31 VITALS — BP 130/74 | HR 79 | Temp 98.5°F | Ht 71.0 in | Wt 265.0 lb

## 2014-07-31 DIAGNOSIS — E119 Type 2 diabetes mellitus without complications: Secondary | ICD-10-CM

## 2014-07-31 LAB — GLUCOSE, POCT (MANUAL RESULT ENTRY): POC GLUCOSE: 146 mg/dL — AB (ref 70–99)

## 2014-07-31 NOTE — Progress Notes (Signed)
Subjective:    Patient ID: Joe Perez, male    DOB: 02-08-1990, 24 y.o.   MRN: [id number]  HPI   Pt in states his bs have been ok. He states last time he checked his blood sugar was 202 in am fasting. Pt states range 100-200 in am fasting. He did not bring his log book. He mentions bs max since last seeing him was 232.  Pt exercises 30 minutes every other day about a mile. Pt states eating less. Pt has stopped processed carbohydrates and soda. Then states occasional regular soda.(which I strongly discouraged).   BS today in office 146.   Review of Systems  Constitutional: Negative for fever, chills and fatigue.  Gastrointestinal: Negative for nausea, vomiting, abdominal pain, diarrhea, blood in stool and abdominal distention.  Endocrine: Negative for polydipsia, polyphagia and polyuria.  Musculoskeletal: Negative for back pain.  Neurological: Negative for dizziness, facial asymmetry, light-headedness and headaches.  Hematological: Negative for adenopathy. Does not bruise/bleed easily.    Past Medical History  Diagnosis Date  . Allergy   . Diabetes 07/02/2014    History   Social History  . Marital Status: Single    Spouse Name: N/A  . Number of Children: N/A  . Years of Education: N/A   Occupational History  . Not on file.   Social History Main Topics  . Smoking status: Never Smoker   . Smokeless tobacco: Never Used  . Alcohol Use: 0.0 oz/week    0 Standard drinks or equivalent per week     Comment: Occasional social drinker. 3-4 beers a month.  . Drug Use: No  . Sexual Activity: Yes   Other Topics Concern  . Not on file   Social History Narrative    No past surgical history on file.  Family History  Problem Relation Age of Onset  . Asthma Mother   . Kidney failure Father     No Known Allergies  Current Outpatient Prescriptions on File Prior to Visit  Medication Sig Dispense Refill  . Blood Glucose Monitoring Suppl (RELION CONFIRM GLUCOSE  MONITOR) W/DEVICE KIT Check bs in am fasting or if symptomatic. 1 kit 0  . glucose blood (ONETOUCH VERIO) test strip Check Blood Sugar 3 times daily. Dx:E11.9 300 each 4  . glyBURIDE (DIABETA) 5 MG tablet Take 2 tablets (10 mg total) by mouth daily with breakfast. 60 tablet 3  . metFORMIN (GLUCOPHAGE) 500 MG tablet Take 1 tablet (500 mg total) by mouth 2 (two) times daily with a meal. 60 tablet 3  . ONETOUCH DELICA LANCETS FINE MISC Check Blood Sugar 3 times daily dx=11.9 300 each 4   No current facility-administered medications on file prior to visit.    BP 130/74 mmHg  Pulse 79  Temp(Src) 98.5 F (36.9 C) (Oral)  Ht 5' 11" (1.803 m)  Wt 265 lb (120.203 kg)  BMI 36.98 kg/m2  SpO2 99%       Objective:   Physical Exam  General- No acute distress. Pleasant patient. Neck- Full range of motion, no jvd Lungs- Clear, even and unlabored. Heart- regular rate and rhythm. Neurologic- CNII- XII grossly intact.   Lower ext- on inspection. No breakdown or ulceration. Between looks like mild early fungal growth. Pulses intact. Sensation intact. See quality metrics.      Assessment & Plan:

## 2014-07-31 NOTE — Patient Instructions (Signed)
Diabetes BS today in office 146. Continue current med regimen metformin and diabeta. Follow up 10-03-2014 for recheck. At that time check cmp, a1-c, urine microalbumin and plan to refer to opthalmology.  Note also come in fasting that day and appointment in am since want to check lipid panel.    Could call and give us daily blood sugar levels over next 2 wks. Contineu exercise as well.

## 2014-07-31 NOTE — Assessment & Plan Note (Addendum)
BS today in office 146. Continue current med regimen metformin and diabeta. Follow up 10-03-2014 for recheck. At that time check cmp, a1-c, urine microalbumin and plan to refer to opthalmology.  Note also come in fasting that day and appointment in am since want to check lipid panel.

## 2014-07-31 NOTE — Progress Notes (Signed)
Pre visit review using our clinic review tool, if applicable. No additional management support is needed unless otherwise documented below in the visit note. 

## 2014-10-03 ENCOUNTER — Ambulatory Visit: Payer: Managed Care, Other (non HMO) | Admitting: Medical

## 2014-10-09 ENCOUNTER — Telehealth: Payer: Self-pay | Admitting: Medical

## 2014-10-09 ENCOUNTER — Encounter: Payer: Self-pay | Admitting: Medical

## 2014-10-09 ENCOUNTER — Telehealth: Payer: Self-pay | Admitting: *Deleted

## 2014-10-09 ENCOUNTER — Ambulatory Visit (INDEPENDENT_AMBULATORY_CARE_PROVIDER_SITE_OTHER): Payer: Managed Care, Other (non HMO) | Admitting: Medical

## 2014-10-09 VITALS — BP 124/78 | HR 81 | Temp 98.1°F | Ht 71.0 in | Wt 245.0 lb

## 2014-10-09 DIAGNOSIS — E119 Type 2 diabetes mellitus without complications: Secondary | ICD-10-CM | POA: Diagnosis not present

## 2014-10-09 DIAGNOSIS — Z23 Encounter for immunization: Secondary | ICD-10-CM

## 2014-10-09 LAB — COMPREHENSIVE METABOLIC PANEL
ALK PHOS: 131 U/L — AB (ref 39–117)
ALT: 37 U/L (ref 0–53)
AST: 24 U/L (ref 0–37)
Albumin: 4.2 g/dL (ref 3.5–5.2)
BUN: 13 mg/dL (ref 6–23)
CHLORIDE: 94 meq/L — AB (ref 96–112)
CO2: 27 mEq/L (ref 19–32)
Calcium: 9.5 mg/dL (ref 8.4–10.5)
Creatinine, Ser: 1.04 mg/dL (ref 0.40–1.50)
GFR: 112.67 mL/min (ref >60.00)
GLUCOSE: 506 mg/dL — AB (ref 70–99)
POTASSIUM: 4.2 meq/L (ref 3.5–5.1)
Sodium: 130 mEq/L — ABNORMAL LOW (ref 135–145)
Total Bilirubin: 0.5 mg/dL (ref 0.2–1.2)
Total Protein: 8.1 g/dL (ref 6.0–8.3)

## 2014-10-09 LAB — LIPID PANEL
CHOLESTEROL: 177 mg/dL (ref 0–200)
HDL: 43.1 mg/dL (ref >39.00)
LDL CALC: 104 mg/dL — AB (ref 0–99)
NonHDL: 133.69
Total CHOL/HDL Ratio: 4
Triglycerides: 149 mg/dL (ref 0.0–149.0)
VLDL: 29.8 mg/dL (ref 0.0–40.0)

## 2014-10-09 LAB — HEMOGLOBIN A1C: HEMOGLOBIN A1C: 13.5 % — AB (ref 4.6–6.5)

## 2014-10-09 LAB — GLUCOSE, POCT (MANUAL RESULT ENTRY): POC Glucose: HIGH mg/dl (ref 70–99)

## 2014-10-09 NOTE — Patient Instructions (Addendum)
Will get labs today to access where you are with diabetes. Labs area cmp, a1c, urine microalbumin.  Also will get lipid panel to associated other potential risks.  Change in management plan dependent on results(continue current tx presently). Follow up in 3 months or as needed

## 2014-10-09 NOTE — Progress Notes (Signed)
Subjective:    Patient ID: Joe Perez, male    DOB: 11/23/1990, 24 y.o.   MRN: [id number]  HPI  Pt in for follow up on his diabetes. Pt has been on metformin and diabeta. Pt states when he checks his bs has not been over 200 but can't give value for his readings.   Pt is exercising. Push ups, sit ups and goes to gym twice a week. Pt diet is better per his pt. Pt states overall his appetite is decreased. No sodas. Rare soda.       Review of Systems  Constitutional: Negative for fever, chills, diaphoresis, activity change and fatigue.  Respiratory: Negative for cough, chest tightness and shortness of breath.   Cardiovascular: Negative for chest pain, palpitations and leg swelling.  Gastrointestinal: Negative for nausea, vomiting and abdominal pain.  Musculoskeletal: Negative for neck pain and neck stiffness.  Neurological: Negative for dizziness, tremors, seizures, syncope, facial asymmetry, speech difficulty, weakness, light-headedness, numbness and headaches.  Psychiatric/Behavioral: Negative for behavioral problems, confusion and agitation. The patient is not nervous/anxious.       Past Medical History  Diagnosis Date  . Allergy   . Diabetes 07/02/2014    Social History   Social History  . Marital Status: Single    Spouse Name: N/A  . Number of Children: N/A  . Years of Education: N/A   Occupational History  . Not on file.   Social History Main Topics  . Smoking status: Never Smoker   . Smokeless tobacco: Never Used  . Alcohol Use: 0.0 oz/week    0 Standard drinks or equivalent per week     Comment: Occasional social drinker. 3-4 beers a month.  . Drug Use: No  . Sexual Activity: Yes   Other Topics Concern  . Not on file   Social History Narrative    No past surgical history on file.  Family History  Problem Relation Age of Onset  . Asthma Mother   . Kidney failure Father     No Known Allergies  Current Outpatient Prescriptions on File Prior  to Visit  Medication Sig Dispense Refill  . Blood Glucose Monitoring Suppl (RELION CONFIRM GLUCOSE MONITOR) W/DEVICE KIT Check bs in am fasting or if symptomatic. 1 kit 0  . glucose blood (ONETOUCH VERIO) test strip Check Blood Sugar 3 times daily. Dx:E11.9 300 each 4  . glyBURIDE (DIABETA) 5 MG tablet Take 2 tablets (10 mg total) by mouth daily with breakfast. 60 tablet 3  . metFORMIN (GLUCOPHAGE) 500 MG tablet Take 1 tablet (500 mg total) by mouth 2 (two) times daily with a meal. 60 tablet 3  . ONETOUCH DELICA LANCETS FINE MISC Check Blood Sugar 3 times daily dx=11.9 300 each 4   No current facility-administered medications on file prior to visit.    BP 124/78 mmHg  Pulse 81  Temp(Src) 98.1 F (36.7 C) (Oral)  Ht _0  (1.803 m)  Wt 245 lb (111.131 kg)  BMI 34.19 kg/m2  SpO2 98%       Objective:   Physical Exam  General Mental Status- Alert. General Appearance- Not in acute distress.   Skin General: Color- Normal Color. Moisture- Normal Moisture.  Neck Carotid Arteries- Normal color. Moisture- Normal Moisture. No carotid bruits. No JVD.  Chest and Lung Exam Auscultation: Breath Sounds:-Normal.  Cardiovascular Auscultation:Rythm- Regular. Murmurs & Other Heart Sounds:Auscultation of the heart reveals- No Murmurs.  Abdomen Inspection:-Inspeection Normal. Palpation/Percussion:Note:No mass. Palpation and Percussion of the abdomen reveal- Non Tender,  Non Distended + BS, no rebound or guarding.    Neurologic Cranial Nerve exam:- CN III-XII intact(No nystagmus), symmetric smile. Strength:- 5/5 equal and symmetric strength both upper and lower extremities.  Lower ext- no break down or ulcers.      Assessment & Plan:  Will get labs today to access where you are with diabetes. Labs area cmp, a1c, urine microalbumin.  Also will get lipid panel to associated other potential risks.  Change in management plan dependent on results(continue current tx presently).  Follow up in 3 months or as

## 2014-10-09 NOTE — Telephone Encounter (Signed)
Tried to call pt again. No answer.

## 2014-10-09 NOTE — Progress Notes (Signed)
Pre visit review using our clinic review tool, if applicable. No additional management support is needed unless otherwise documented below in the visit note. 

## 2014-10-09 NOTE — Telephone Encounter (Signed)
I called pt and no answer. Preferred not to leave message for privacy reasons but due to his abnormal high sugar had to. Explained high bs level. Take meds, hydrate well. Come in tomorrow. We need to change med management plan. Mabe Francesco Runner MA tried to call pt as well earlier.   Considering getting c-peptide.  Will get MA to try to call pt tomorrow in the am.

## 2014-10-09 NOTE — Telephone Encounter (Signed)
Received call from Ravinia at [facility] with critical lab results.  Glucose:506.   Results verbally given to Edward.//AB/CMA

## 2014-10-10 ENCOUNTER — Ambulatory Visit (INDEPENDENT_AMBULATORY_CARE_PROVIDER_SITE_OTHER): Payer: Managed Care, Other (non HMO) | Admitting: Medical

## 2014-10-10 ENCOUNTER — Encounter: Payer: Self-pay | Admitting: Medical

## 2014-10-10 ENCOUNTER — Telehealth: Payer: Self-pay | Admitting: Medical

## 2014-10-10 VITALS — BP 124/73 | HR 97 | Temp 98.5°F | Ht 71.0 in | Wt 239.8 lb

## 2014-10-10 DIAGNOSIS — E1165 Type 2 diabetes mellitus with hyperglycemia: Secondary | ICD-10-CM | POA: Diagnosis not present

## 2014-10-10 LAB — MICROALBUMIN, URINE: Microalb, Ur: 3.8 mg/dL — ABNORMAL HIGH (ref <2.0)

## 2014-10-10 LAB — GLUCOSE, POCT (MANUAL RESULT ENTRY): POC GLUCOSE: 466 mg/dL — AB (ref 70–99)

## 2014-10-10 NOTE — Progress Notes (Signed)
Subjective:    Patient ID: Joe Perez, male    DOB: 1990-02-20, 24 y.o.   MRN: [id number]  HPI  Pt in for follow up. His blood sugar has was over 500 yesterday. His a1-c was 13.5. This was despite being on metformin 1000 mg bid and  diabeta 10 mg a day. Pt described that he was not eating much and had cut back on obvious high sugar foods and drinks. We tried to call him yesterday but had to leave messages. Got in touch this am and set appointment up before the weekend.  His blood sugar today in office was 466.   Above were my summary of recent visits. Then as I  was explaining to him my i was  surprised that the medication appeared  to have not helped at all and that other meds likely needed he admitted he only  Took diabetic meds for  one month  then he stopped. He states he noticed a big drop all the way down to 150-250 range while on the med. A lot of times bs was near 150 . He has fear of meds since his dad diabetic and he attributed his dad  dialysis and bilateral amputation  to his diabetic  medications. Therefore he stopped meds. He apoligized for not being up front with me yesterday when he was in. He does state that when he stopped using diabetic meds he did have a lot of frequent urination but otherwise he states he feels great.    Review of Systems  Constitutional: Negative for fever, chills, diaphoresis, activity change and fatigue.  Respiratory: Negative for cough, chest tightness and shortness of breath.   Cardiovascular: Negative for chest pain, palpitations and leg swelling.  Gastrointestinal: Negative for nausea, vomiting and abdominal pain.  Endocrine: Positive for polydipsia and polyuria. Negative for polyphagia.  Musculoskeletal: Negative for neck pain and neck stiffness.  Neurological: Negative for dizziness and light-headedness.  Psychiatric/Behavioral: Negative for behavioral problems, confusion and agitation. The patient is not nervous/anxious.    Past Medical  History  Diagnosis Date  . Allergy   . Diabetes 07/02/2014    Social History   Social History  . Marital Status: Single    Spouse Name: N/A  . Number of Children: N/A  . Years of Education: N/A   Occupational History  . Not on file.   Social History Main Topics  . Smoking status: Never Smoker   . Smokeless tobacco: Never Used  . Alcohol Use: 0.0 oz/week    0 Standard drinks or equivalent per week     Comment: Occasional social drinker. 3-4 beers a month.  . Drug Use: No  . Sexual Activity: Yes   Other Topics Concern  . Not on file   Social History Narrative    No past surgical history on file.  Family History  Problem Relation Age of Onset  . Asthma Mother   . Kidney failure Father     No Known Allergies  Current Outpatient Prescriptions on File Prior to Visit  Medication Sig Dispense Refill  . Blood Glucose Monitoring Suppl (RELION CONFIRM GLUCOSE MONITOR) W/DEVICE KIT Check bs in am fasting or if symptomatic. 1 kit 0  . glucose blood (ONETOUCH VERIO) test strip Check Blood Sugar 3 times daily. Dx:E11.9 300 each 4  . glyBURIDE (DIABETA) 5 MG tablet Take 2 tablets (10 mg total) by mouth daily with breakfast. 60 tablet 3  . metFORMIN (GLUCOPHAGE) 500 MG tablet Take 1 tablet (500 mg  total) by mouth 2 (two) times daily with a meal. 60 tablet 3  . ONETOUCH DELICA LANCETS FINE MISC Check Blood Sugar 3 times daily dx=11.9 300 each 4   No current facility-administered medications on file prior to visit.    There were no vitals taken for this visit.       Objective:   Physical Exam  General- No acute distress. Pleasant patient. Neck- Full range of motion, no jvd Lungs- Clear, even and unlabored. Heart- regular rate and rhythm. Neurologic- CNII- XII grossly intact. Brief exam today. Saw pt yesterday.      Assessment & Plan:  Your sugar was high and your noncompliance over past 2 months with no medications likely attributed to your high blood  sugar.  Please start med daily and check bs fasting in am and check post meal. Document readings and update [place] next Wednesday. Follow up in 10 days with readings as well.  I was about to prescribe insulin today until you told me you had not taken any meds for 2 months. We may need to add other tablet in near future if your bs are not coming down.  In 3 months if your a1-c still high insulin maybe needed.   If high blood sugars with severe symptoms then ED evaluation as well.  Pt has medication as at home. He promises he will start today.

## 2014-10-10 NOTE — Telephone Encounter (Signed)
Pt admitted he was not taking any diabetic meds for 2 months thus the very high sugar levels. He should call on wed this coming week with some BS readings. Will you call him on Wednesday if he does not call us.Thanks. He should be taking his meds now.

## 2014-10-10 NOTE — Telephone Encounter (Signed)
Pt has appt this afternoon

## 2014-10-10 NOTE — Progress Notes (Signed)
Pre visit review using our clinic review tool, if applicable. No additional management support is needed unless otherwise documented below in the visit note. 

## 2014-10-10 NOTE — Patient Instructions (Addendum)
Your sugar was high and your noncompliance over past 2 months with no medications likely attributed to your high blood sugar.  Please start med daily and check bs fasting in am and check post meal. Document readings and update [place] next Wednesday. Follow up in 10 days with readings as well.  I was about to prescribe insulin today until you told me you had not taken any meds for 2 months. We may need to add other tablet in near future if your bs are not coming down.  In 3 months if your a1-c still high insulin maybe needed.   If high blood sugars with severe symptoms then ED evaluation as well.

## 2014-10-11 LAB — C-PEPTIDE: C-Peptide: 3 ng/mL (ref 0.80–3.90)

## 2014-10-13 ENCOUNTER — Encounter: Payer: Self-pay | Admitting: Medical

## 2014-10-15 NOTE — Telephone Encounter (Signed)
Spoke with pt and he does not have the list with him but the one from this morning was 240 fasting. FYI. I let him know to call back with some other readings when he had them with him.

## 2014-10-20 ENCOUNTER — Encounter: Payer: Managed Care, Other (non HMO) | Admitting: Medical

## 2014-10-20 NOTE — Progress Notes (Signed)
This encounter was created in error - please disregard.

## 2014-10-23 NOTE — Addendum Note (Signed)
Addended by: Gwenevere Abbot on: 10/23/2014 08:35 AM   Modules accepted: Kipp Brood

## 2014-10-27 ENCOUNTER — Telehealth: Payer: Self-pay | Admitting: Medical

## 2014-10-27 NOTE — Telephone Encounter (Signed)
Patient dismissed from [facility] by Esperanza Richters PA-C , effective October 23, 2014. Dismissal letter sent out by certified / registered mail.  [facility]  Certified dismissal letter returned as undeliverable, unclaimed, return to sender after three attempts by [employer]. Letter placed in another envelope and resent as 1st class mail which does not require a signature. 11/28/14 DAJ

## 2014-12-09 ENCOUNTER — Telehealth: Payer: Self-pay | Admitting: Medical

## 2014-12-09 NOTE — Telephone Encounter (Signed)
Certified dismissal letter returned as undeliverable, unclaimed, [facility]. 1st class mail delivery failed due to insufficient address. 12/09/2014 DAJ

## 2014-12-16 NOTE — Telephone Encounter (Signed)
Your staff will need to contact the patient for the correct/updated address.   DAJ

## 2015-01-08 ENCOUNTER — Ambulatory Visit: Payer: Managed Care, Other (non HMO) | Admitting: Medical

## 2015-08-27 ENCOUNTER — Emergency Department (HOSPITAL_COMMUNITY)
Admission: EM | Admit: 2015-08-27 | Discharge: 2015-08-28 | Disposition: A | Discharge status: Home / Self Care | Payer: Managed Care, Other (non HMO) | Attending: Emergency Medicine | Admitting: Emergency Medicine

## 2015-08-27 ENCOUNTER — Encounter (HOSPITAL_COMMUNITY): Payer: Self-pay | Admitting: Emergency Medicine

## 2015-08-27 DIAGNOSIS — Z7984 Long term (current) use of oral hypoglycemic drugs: Secondary | ICD-10-CM | POA: Insufficient documentation

## 2015-08-27 DIAGNOSIS — E1165 Type 2 diabetes mellitus with hyperglycemia: Secondary | ICD-10-CM | POA: Diagnosis not present

## 2015-08-27 DIAGNOSIS — M79674 Pain in right toe(s): Secondary | ICD-10-CM

## 2015-08-27 DIAGNOSIS — R739 Hyperglycemia, unspecified: Secondary | ICD-10-CM

## 2015-08-27 DIAGNOSIS — M79671 Pain in right foot: Secondary | ICD-10-CM | POA: Insufficient documentation

## 2015-08-27 LAB — COMPREHENSIVE METABOLIC PANEL
ALT: 34 U/L (ref 17–63)
AST: 26 U/L (ref 15–41)
Albumin: 3.8 g/dL (ref 3.5–5.0)
Alkaline Phosphatase: 115 U/L (ref 38–126)
Anion gap: 10 (ref 5–15)
BUN: <5 mg/dL — ABNORMAL LOW (ref 6–20)
CHLORIDE: 95 mmol/L — AB (ref 101–111)
CO2: 25 mmol/L (ref 22–32)
CREATININE: 0.97 mg/dL (ref 0.61–1.24)
Calcium: 9.5 mg/dL (ref 8.9–10.3)
GFR calc Af Amer: >60 mL/min (ref >60)
GFR calc non Af Amer: >60 mL/min (ref >60)
Glucose, Bld: 492 mg/dL — ABNORMAL HIGH (ref 65–99)
POTASSIUM: 3.9 mmol/L (ref 3.5–5.1)
Sodium: 130 mmol/L — ABNORMAL LOW (ref 135–145)
Total Bilirubin: 0.5 mg/dL (ref 0.3–1.2)
Total Protein: 7.3 g/dL (ref 6.5–8.1)

## 2015-08-27 LAB — CBC WITH DIFFERENTIAL/PLATELET
Basophils Absolute: 0 10*3/uL (ref 0.0–0.1)
Basophils Relative: 0 %
Eosinophils Absolute: 2.5 10*3/uL — ABNORMAL HIGH (ref 0.0–0.7)
Eosinophils Relative: 24 %
HEMATOCRIT: 40 % (ref 39.0–52.0)
HEMOGLOBIN: 13.5 g/dL (ref 13.0–17.0)
LYMPHS PCT: 21 %
Lymphs Abs: 2.2 10*3/uL (ref 0.7–4.0)
MCH: 27.6 pg (ref 26.0–34.0)
MCHC: 33.8 g/dL (ref 30.0–36.0)
MCV: 81.6 fL (ref 78.0–100.0)
MONOS PCT: 6 %
Monocytes Absolute: 0.7 10*3/uL (ref 0.1–1.0)
NEUTROS ABS: 5 10*3/uL (ref 1.7–7.7)
NEUTROS PCT: 49 %
Platelets: 319 10*3/uL (ref 150–400)
RBC: 4.9 MIL/uL (ref 4.22–5.81)
RDW: 12.2 % (ref 11.5–15.5)
WBC: 10.3 10*3/uL (ref 4.0–10.5)

## 2015-08-27 NOTE — ED Triage Notes (Signed)
Pt. reports worsening infected wound between his right 4th and 5th toe with swelling and drainage , denies fever or chills.

## 2015-08-28 ENCOUNTER — Emergency Department (HOSPITAL_COMMUNITY): Payer: Managed Care, Other (non HMO)

## 2015-08-28 LAB — CBG MONITORING, ED: Glucose-Capillary: 286 mg/dL — ABNORMAL HIGH (ref 65–99)

## 2015-08-28 MED ORDER — SODIUM CHLORIDE 0.9 % IV BOLUS (SEPSIS)
1000.0000 mL | Freq: Once | INTRAVENOUS | Status: AC
  Administered 2015-08-28: 1000 mL via INTRAVENOUS

## 2015-08-28 MED ORDER — INSULIN ASPART 100 UNIT/ML ~~LOC~~ SOLN
5.0000 [IU] | Freq: Once | SUBCUTANEOUS | Status: AC
  Administered 2015-08-28: 5 [IU] via INTRAVENOUS
  Filled 2015-08-28: qty 1

## 2015-08-28 MED ORDER — CLINDAMYCIN HCL 150 MG PO CAPS
450.0000 mg | ORAL_CAPSULE | Freq: Once | ORAL | Status: AC
  Administered 2015-08-28: 450 mg via ORAL
  Filled 2015-08-28: qty 3

## 2015-08-28 MED ORDER — CLINDAMYCIN HCL 150 MG PO CAPS: 450.0000 mg | ORAL_CAPSULE | Freq: Three times a day (TID) | ORAL | 0 refills | Status: AC

## 2015-08-28 NOTE — ED Notes (Signed)
Elevated patient's foot. Waiting on MD to see patient.

## 2015-08-28 NOTE — Discharge Instructions (Signed)
Read the information below.   Your x-rays were negative for fracture, dislocation, or infection. Your sugars were elevated in the ED, you were given insulin. You were also given antibiotics to cover against a possible skin infection. Take as prescribed. If you develop severe abdominal pain or watery diarrhea return to ED.  Be sure to wear shoes at all times. Keep feet clean and dry and do a daily check of your feet.  Use the prescribed medication as directed.  Please discuss all new medications with your pharmacist.   I [facility] the [facility]. Please call to schedule a follow up appointment ASAP to establish a primary care doctor.  You may return to the Emergency Department at any time for worsening condition or any new symptoms that concern you. Return to ED if you develop  worsening symptoms or fever, red streaking, calf pain, increased swelling/redness/warmth.

## 2015-08-28 NOTE — ED Provider Notes (Signed)
[place] DEPT Provider Note   CSN: [id number] Arrival date & time: 08/27/15  1944  First Provider Contact:  None       History   Chief Complaint Chief Complaint  Patient presents with  . Foot Pain    HPI Joe Perez is a 25 y.o. male.  Joe Perez is a 25 y.o. male with h/o diabetes presents to ED with complaint of right 5th digit pain. Patient reports he had an old, healed diabetic wound on the right medial 5th digit. He states he started having pain on Tuesday and has progressively worsened with swelling and warmth of 5th digit and foot/ankle. He states last night the he had active purulent drainage. He denies fever, sore throat, changes in vision, chest pain, shortness of breath, abdominal complains, urinary complaints, headache, numbness, or LOC. Patient reports diabetes not well controlled. He take metformin and glyburide. He checks his sugars intermittently and states they typically run close to 500. He is currently trying to establish a new PCP.       Past Medical History:  Diagnosis Date  . Allergy   . Diabetes ([place]) 07/02/2014    Patient Active Problem List   Diagnosis Date Noted  . Allergic rhinitis 07/02/2014  . Hyperglycemia 07/02/2014  . Diabetes ([place]) 07/02/2014    History reviewed. No pertinent surgical history.     Home Medications    Prior to Admission medications   Medication Sig Start Date End Date Taking? Authorizing Provider  glyBURIDE (DIABETA) 5 MG tablet Take 2 tablets (10 mg total) by mouth daily with breakfast. 07/13/14  Yes Mackie Pai, PA-C  metFORMIN (GLUCOPHAGE) 500 MG tablet Take 1 tablet (500 mg total) by mouth 2 (two) times daily with a meal. 07/02/14  Yes Mackie Pai, PA-C  Blood Glucose Monitoring Suppl (RELION CONFIRM GLUCOSE MONITOR) W/DEVICE KIT Check bs in am fasting or if symptomatic. 07/02/14   Percell Miller Saguier, PA-C  clindamycin (CLEOCIN) 150 MG capsule Take 3 capsules (450 mg total) by mouth 3 (three) times  daily. 08/28/15 09/02/15  Roxanna Mew, PA-C  glucose blood [facility] VERIO) test strip Check Blood Sugar 3 times daily. Dx:E11.9 07/04/14   Mackie Pai, PA-C  ONETOUCH DELICA LANCETS FINE MISC Check Blood Sugar 3 times daily dx=11.9 07/04/14   Mackie Pai, PA-C    Family History Family History  Problem Relation Age of Onset  . Asthma Mother   . Kidney failure Father     Social History Social History  Substance Use Topics  . Smoking status: Never Smoker  . Smokeless tobacco: Never Used  . Alcohol use Yes     Allergies   Review of patient's allergies indicates no known allergies.   Review of Systems Review of Systems  Musculoskeletal: Positive for arthralgias ( right 5th toe) and joint swelling ( right 5th toe).  Skin: Positive for color change.  All other systems reviewed and are negative.    Physical Exam Updated Vital Signs BP 136/76 (BP Location: Right Arm)   Pulse 72   Temp 98.8 F (37.1 C) (Oral)   Resp 16   Ht 5' 11"  (1.803 m)   Wt 99.9 kg   SpO2 98%   BMI 30.71 kg/m   Physical Exam  Constitutional: He appears well-developed and well-nourished. No distress.  HENT:  Head: Normocephalic and atraumatic.  Mouth/Throat: Oropharynx is clear and moist. No oropharyngeal exudate.  Eyes: Conjunctivae and EOM are normal. Pupils are equal, round, and reactive to light. Right eye exhibits no discharge.  Left eye exhibits no discharge. No scleral icterus.  Neck: Normal range of motion. Neck supple.  Cardiovascular: Normal rate, regular rhythm, normal heart sounds and intact distal pulses.   No murmur heard. Pulmonary/Chest: Effort normal and breath sounds normal. No respiratory distress.  Abdominal: Soft. Bowel sounds are normal. There is no tenderness. There is no rebound and no guarding.  Musculoskeletal: Normal range of motion.       Right foot: There is tenderness and bony tenderness.  Swelling, erythema, and warmth noted at right 5th digit. TTP of 5th  digit. No obvious open wound. No presence of ulceration. No are of induration or fluctuance. No active drainage. Associated swelling noted of right foot and ankle. No TTP of posterior calf. No palpable cords. Distal pulses intact. Sensation intact. Strength intact.    Lymphadenopathy:    He has no cervical adenopathy.  Neurological: He is alert. He is not disoriented. Coordination normal. GCS eye subscore is 4. GCS verbal subscore is 5. GCS motor subscore is 6.  Skin: Skin is warm and dry. He is not diaphoretic.  No obvious open wounds noted on lower extremities.   Psychiatric: He has a normal mood and affect.     ED Treatments / Results  Labs (all labs ordered are listed, but only abnormal results are displayed) Labs Reviewed  CBC WITH DIFFERENTIAL/PLATELET - Abnormal; Notable for the following:       Result Value   Eosinophils Absolute 2.5 (*)    All other components within normal limits  COMPREHENSIVE METABOLIC PANEL - Abnormal; Notable for the following:    Sodium 130 (*)    Chloride 95 (*)    Glucose, Bld 492 (*)    BUN <5 (*)    All other components within normal limits  CBG MONITORING, ED - Abnormal; Notable for the following:    Glucose-Capillary 286 (*)    All other components within normal limits    EKG  EKG Interpretation None       Radiology Dg Foot Complete Right  Result Date: 08/28/2015 CLINICAL DATA:  25 year old male with right foot pain and wound on little toe. EXAM: RIGHT FOOT COMPLETE - 3+ VIEW COMPARISON:  None. FINDINGS: There is no evidence of fracture, subluxation or dislocation. No radiographic evidence of osteomyelitis noted. No radiopaque foreign bodies are present. No focal bony lesions are identified. IMPRESSION: No evidence of bony abnormality. Electronically Signed   By: Margarette Canada M.D.   On: 08/28/2015 02:26    Procedures Procedures (including critical care time)  Medications Ordered in ED Medications  sodium chloride 0.9 % bolus 1,000 mL  (0 mLs Intravenous Stopped 08/28/15 0418)  insulin aspart (novoLOG) injection 5 Units (5 Units Intravenous Given 08/28/15 0253)  clindamycin (CLEOCIN) capsule 450 mg (450 mg Oral Given 08/28/15 0259)     Initial Impression / Assessment and Plan / ED Course  I have reviewed the triage vital signs and the nursing notes.  Pertinent labs & imaging results that were available during my care of the patient were reviewed by me and considered in my medical decision making (see chart for details).  Clinical Course  Value Comment By Time  DG Foot Complete Right No obvious fracture or dislocation. No evidence of osteomyelitis. No soft tissue swelling.  Roxanna Mew, PA-C 08/11 0300    Patient is afebrile and non-toxic appearing in NAD. Vital signs are stable. Physical exam remarkable for swelling of 5th digit of right foot with TTP and erythema. Associated swelling  of foot and ankle appreciated. No obvious wound or ulceration noted. No discharge. No area of fluctuance or induration.  Sensation, strength, and pulses intact. No TTP of posterior calf, no palpable cords - low suspicion for DVT. CBG remarkable for hyperglycemia. Will x-ray foot for further evaluation.   CBC re-assuring. CMP remarkable for hyperglycemia without evidence of DKA. Mild hyponatremia and hypochloremia - may be secondary to dehydration. Will give IVF and insulin. X-ray negative for fracture, dislocation, osteomyelitis, or soft tissue swelling. Doubt septic joint. Suspect possible ?cellulitis vs. ?gout. Clindamycin given in ED.  Sugars improved following IVFs and insulin. Discussed results with patient. Discussed importance of medication compliance and follow up with PCP regarding sugars. Discussed results associated with uncontrolled sugars. Provided contact information for [facility] to establish a PCP. Rx clindamycin. F/u if no improvement in 2-3 days. Symptomatic managed discussed. Return precautions and  medication side effects provided. Patient voiced understanding and I agreeable.   Final Clinical Impressions(s) / ED Diagnoses   Final diagnoses:  Pain of toe of right foot  Hyperglycemia    New Prescriptions Discharge Medication List as of 08/28/2015  2:59 AM    START taking these medications   Details  clindamycin (CLEOCIN) 150 MG capsule Take 3 capsules (450 mg total) by mouth 3 (three) times daily., Starting Fri 08/28/2015, Until Wed 09/02/2015, Print         [place], [place] 08/29/15 2257    Ripley Fraise, MD 08/30/15 [phone]

## 2015-08-28 NOTE — ED Notes (Signed)
Patient transported to x-ray. ?

## 2015-09-09 ENCOUNTER — Other Ambulatory Visit: Payer: Self-pay | Admitting: Medical

## 2015-09-09 NOTE — Telephone Encounter (Signed)
Last visit 09/2014 please advise regarding refills.

## 2015-10-08 ENCOUNTER — Encounter: Payer: Self-pay | Admitting: Endocrinology

## 2015-10-08 ENCOUNTER — Ambulatory Visit: Payer: Managed Care, Other (non HMO) | Admitting: Dietician

## 2015-10-08 ENCOUNTER — Ambulatory Visit (INDEPENDENT_AMBULATORY_CARE_PROVIDER_SITE_OTHER): Payer: Managed Care, Other (non HMO) | Admitting: Endocrinology

## 2015-10-08 VITALS — BP 122/70 | HR 64 | Ht 71.0 in | Wt 222.0 lb

## 2015-10-08 DIAGNOSIS — E1165 Type 2 diabetes mellitus with hyperglycemia: Secondary | ICD-10-CM | POA: Diagnosis not present

## 2015-10-08 DIAGNOSIS — Z794 Long term (current) use of insulin: Secondary | ICD-10-CM | POA: Diagnosis not present

## 2015-10-08 MED ORDER — GLUCOSE BLOOD VI STRP: 1.0000 | ORAL_STRIP | Freq: Two times a day (BID) | 12 refills | Status: AC

## 2015-10-08 MED ORDER — INSULIN GLARGINE 100 UNIT/ML SOLOSTAR PEN: 30.0000 [IU] | PEN_INJECTOR | SUBCUTANEOUS | 99 refills | Status: DC

## 2015-10-08 NOTE — Patient Instructions (Addendum)
good diet and exercise significantly improve the control of your diabetes.  please let me know if you wish to be referred to a dietician.  high blood sugar is very risky to your health.  you should see an eye doctor and dentist every year.  It is very important to get all recommended vaccinations.  Controlling your blood pressure and cholesterol drastically reduces the damage diabetes does to your body.  Those who smoke should quit.  Please discuss these with your doctor.  check your blood sugar twice a day.  vary the time of day when you check, between before the 3 meals, and at bedtime.  also check if you have symptoms of your blood sugar being too high or too low.  please keep a record of the readings and bring it to your next appointment here (or you can bring the meter itself).  You can write it on any piece of paper.  please call us sooner if your blood sugar goes below 70, or if you have a lot of readings over 200.  Please increase the lantus to 30 units each morning.  I have sent a prescription to your pharmacy.   Please call us next week, to tell us how the blood sugar is doing.   Please come back for a follow-up appointment in 2 weeks.

## 2015-10-08 NOTE — Progress Notes (Signed)
 Subjective:    Patient ID: Joe Perez, male    DOB: 04/28/1990, 25 y.o.   MRN: [id number]  HPI pt states DM was dx'ed in 2015; he has mild if any neuropathy of the lower extremities; he is unaware of any associated chronic complications; he started lantus, 3 days ago; pt says his diet and exercise are not good; he has never had pancreatitis, severe hypoglycemia or DKA. Since on the insulin, cbg's have continued to run 300-400's.   Past Medical History:  Diagnosis Date  . Allergy   . Diabetes ([facility]) 07/02/2014    No past surgical history on file.  Social History   Social History  . Marital status: Single    Spouse name: N/A  . Number of children: N/A  . Years of education: N/A   Occupational History  . Not on file.   Social History Main Topics  . Smoking status: Never Smoker  . Smokeless tobacco: Never Used  . Alcohol use Yes  . Drug use: No  . Sexual activity: Yes   Other Topics Concern  . Not on file   Social History Narrative  . No narrative on file    Current Outpatient Prescriptions on File Prior to Visit  Medication Sig Dispense Refill  . Blood Glucose Monitoring Suppl (RELION CONFIRM GLUCOSE MONITOR) W/DEVICE KIT Check bs in am fasting or if symptomatic. 1 kit 0  . glyBURIDE (DIABETA) 5 MG tablet Take 2 tablets (10 mg total) by mouth daily with breakfast. 60 tablet 3  . metFORMIN (GLUCOPHAGE) 500 MG tablet Take 1 tablet (500 mg total) by mouth 2 (two) times daily with a meal. 60 tablet 3  . ONETOUCH DELICA LANCETS FINE MISC Check Blood Sugar 3 times daily dx=11.9 300 each 4   No current facility-administered medications on file prior to visit.     No Known Allergies  Family History  Problem Relation Age of Onset  . Asthma Mother   . Kidney failure Father   . Diabetes Sister     BP 122/70   Pulse 64   Ht 5' 11" (1.803 m)   Wt 222 lb (100.7 kg)   SpO2 97%   BMI 30.96 kg/m   Review of Systems denies headache, chest pain, sob, n/v, muscle  cramps, excessive diaphoresis, depression, cold intolerance, rhinorrhea, and easy bruising.  He has lost 65 lbs since 2015.  He has slight blurry vision and polyuria.     Objective:   Physical Exam VS: see vs page GEN: no distress HEAD: head: no deformity eyes: no periorbital swelling, no proptosis external nose and ears are normal mouth: no lesion seen NECK: supple, thyroid is not enlarged CHEST WALL: no deformity LUNGS: clear to auscultation CV: reg rate and rhythm, no murmur ABD: abdomen is soft, nontender.  no hepatosplenomegaly.  not distended.  no hernia MUSCULOSKELETAL: muscle bulk and strength are grossly normal.  no obvious joint swelling.  gait is normal and steady EXTEMITIES: no deformity.  no ulcer on the feet.  feet are of normal color and temp.  no edema PULSES: dorsalis pedis intact bilat.  no carotid bruit NEURO:  cn 2-12 grossly intact.   readily moves all 4's.  sensation is intact to touch on the feet SKIN:  Normal texture and temperature.  No rash or suspicious lesion is visible.   NODES:  None palpable at the neck PSYCH: alert, well-oriented.  Does not appear anxious nor depressed.    A1c=16.5%  I have reviewed outside records,   and summarized: Pt was noted to have severely elevated a1c, and referred here.  He was noted to have weight loss.  Insulin instruction was documented     Assessment & Plan:  Insulin-requiring type 2 DM, severe exacerbation.  He may be developing type 1.   Weight loss, due to severe hyperglycemia.  We'll follow.   Blurry vision, new to me: I told pt this will get better with improvement in glycemic control.  For the meantime, he should buy reading glasses.  

## 2015-10-09 ENCOUNTER — Encounter: Payer: Self-pay | Admitting: Endocrinology

## 2015-10-22 ENCOUNTER — Ambulatory Visit (INDEPENDENT_AMBULATORY_CARE_PROVIDER_SITE_OTHER): Payer: Managed Care, Other (non HMO) | Admitting: Endocrinology

## 2015-10-22 VITALS — BP 122/78 | HR 69 | Ht 71.0 in | Wt 225.0 lb

## 2015-10-22 DIAGNOSIS — Z794 Long term (current) use of insulin: Secondary | ICD-10-CM | POA: Diagnosis not present

## 2015-10-22 DIAGNOSIS — E1165 Type 2 diabetes mellitus with hyperglycemia: Secondary | ICD-10-CM | POA: Diagnosis not present

## 2015-10-22 MED ORDER — INSULIN GLARGINE 100 UNIT/ML SOLOSTAR PEN: 40.0000 [IU] | PEN_INJECTOR | SUBCUTANEOUS | 99 refills | Status: AC

## 2015-10-22 NOTE — Patient Instructions (Addendum)
check your blood sugar twice a day.  vary the time of day when you check, between before the 3 meals, and at bedtime.  also check if you have symptoms of your blood sugar being too high or too low.  please keep a record of the readings and bring it to your next appointment here (or you can bring the meter itself).  You can write it on any piece of paper.  please call us sooner if your blood sugar goes below 70, or if you have a lot of readings over 200.   Please increase the lantus to 40 units each morning.  Please continue the same metformin.  Please call us next week, to tell us how the blood sugar is doing.   Our goal is to get it to the low-100's, at some time of day.   Please come back for a follow-up appointment in 4-6 weeks.

## 2015-10-22 NOTE — Progress Notes (Signed)
Subjective:    Patient ID: Joe Perez, male    DOB: Jan 03, 1991, 25 y.o.   MRN: [id number]  HPI  Pt returns for f/u of diabetes mellitus: DM type: Insulin-requiring type 2 (but he may be developing type 1).  Dx'ed: 9892 Complications: none Therapy: insulin since 2017, and metformin DKA: never Severe hypoglycemia: never Pancreatitis: never Other: he works noon-8 PM at [employer]; he is on qd insulin, at least for now. Interval history:  He brings a record of his cbg's which I have reviewed today.  It varies from 192-414 fasting, which is the only time of day he checks.  pt states he feels better in general.  In particular, polyuria is much less now. He has stopped the glyburide.   Past Medical History:  Diagnosis Date  . Allergy   . Diabetes ([place]) 07/02/2014    No past surgical history on file.  Social History   Social History  . Marital status: Single    Spouse name: N/A  . Number of children: N/A  . Years of education: N/A   Occupational History  . Not on file.   Social History Main Topics  . Smoking status: Never Smoker  . Smokeless tobacco: Never Used  . Alcohol use Yes  . Drug use: No  . Sexual activity: Yes   Other Topics Concern  . Not on file   Social History Narrative  . No narrative on file    Current Outpatient Prescriptions on File Prior to Visit  Medication Sig Dispense Refill  . Blood Glucose Monitoring Suppl (RELION CONFIRM GLUCOSE MONITOR) W/DEVICE KIT Check bs in am fasting or if symptomatic. 1 kit 0  . glucose blood (ONE TOUCH ULTRA TEST) test strip 1 each by Other route 2 (two) times daily. And lancets 2/day 100 each 12  . metFORMIN (GLUCOPHAGE) 500 MG tablet Take 1 tablet (500 mg total) by mouth 2 (two) times daily with a meal. 60 tablet 3  . ONETOUCH DELICA LANCETS FINE MISC Check Blood Sugar 3 times daily dx=11.9 300 each 4   No current facility-administered medications on file prior to visit.     No Known  Allergies  Family History  Problem Relation Age of Onset  . Asthma Mother   . Kidney failure Father   . Diabetes Sister    BP 122/78   Pulse 69   Ht 5' 11"  (1.803 m)   Wt 225 lb (102.1 kg)   SpO2 94%   BMI 31.38 kg/m   Review of Systems He denies hypoglycemia.      Objective:   Physical Exam VITAL SIGNS:  See vs page GENERAL: no distress Pulses: dorsalis pedis intact bilat.   MSK: no deformity of the feet CV: no leg edema Skin:  no ulcer on the feet.  normal color and temp on the feet. Neuro: sensation is intact to touch on the feet.      Assessment & Plan:  Insulin-requiring type 2 DM: he needs increased rx.  Patient is advised the following: Patient Instructions  check your blood sugar twice a day.  vary the time of day when you check, between before the 3 meals, and at bedtime.  also check if you have symptoms of your blood sugar being too high or too low.  please keep a record of the readings and bring it to your next appointment here (or you can bring the meter itself).  You can write it on any piece of paper.  please call  us sooner if your blood sugar goes below 70, or if you have a lot of readings over 200.   Please increase the lantus to 40 units each morning.  Please continue the same metformin.  Please call us next week, to tell us how the blood sugar is doing.   Our goal is to get it to the low-100's, at some time of day.   Please come back for a follow-up appointment in 4-6 weeks.

## 2015-12-03 ENCOUNTER — Ambulatory Visit: Payer: Managed Care, Other (non HMO) | Admitting: Endocrinology

## 2017-08-17 IMAGING — CR DG FOOT COMPLETE 3+V*R*
3 series · 3 of 3 positions shown · non-contrast
Comparison: None.

CLINICAL DATA: 25-year-old male with right foot pain and wound on
little toe.

EXAM:
RIGHT FOOT COMPLETE - 3+ VIEW

[foot ap]
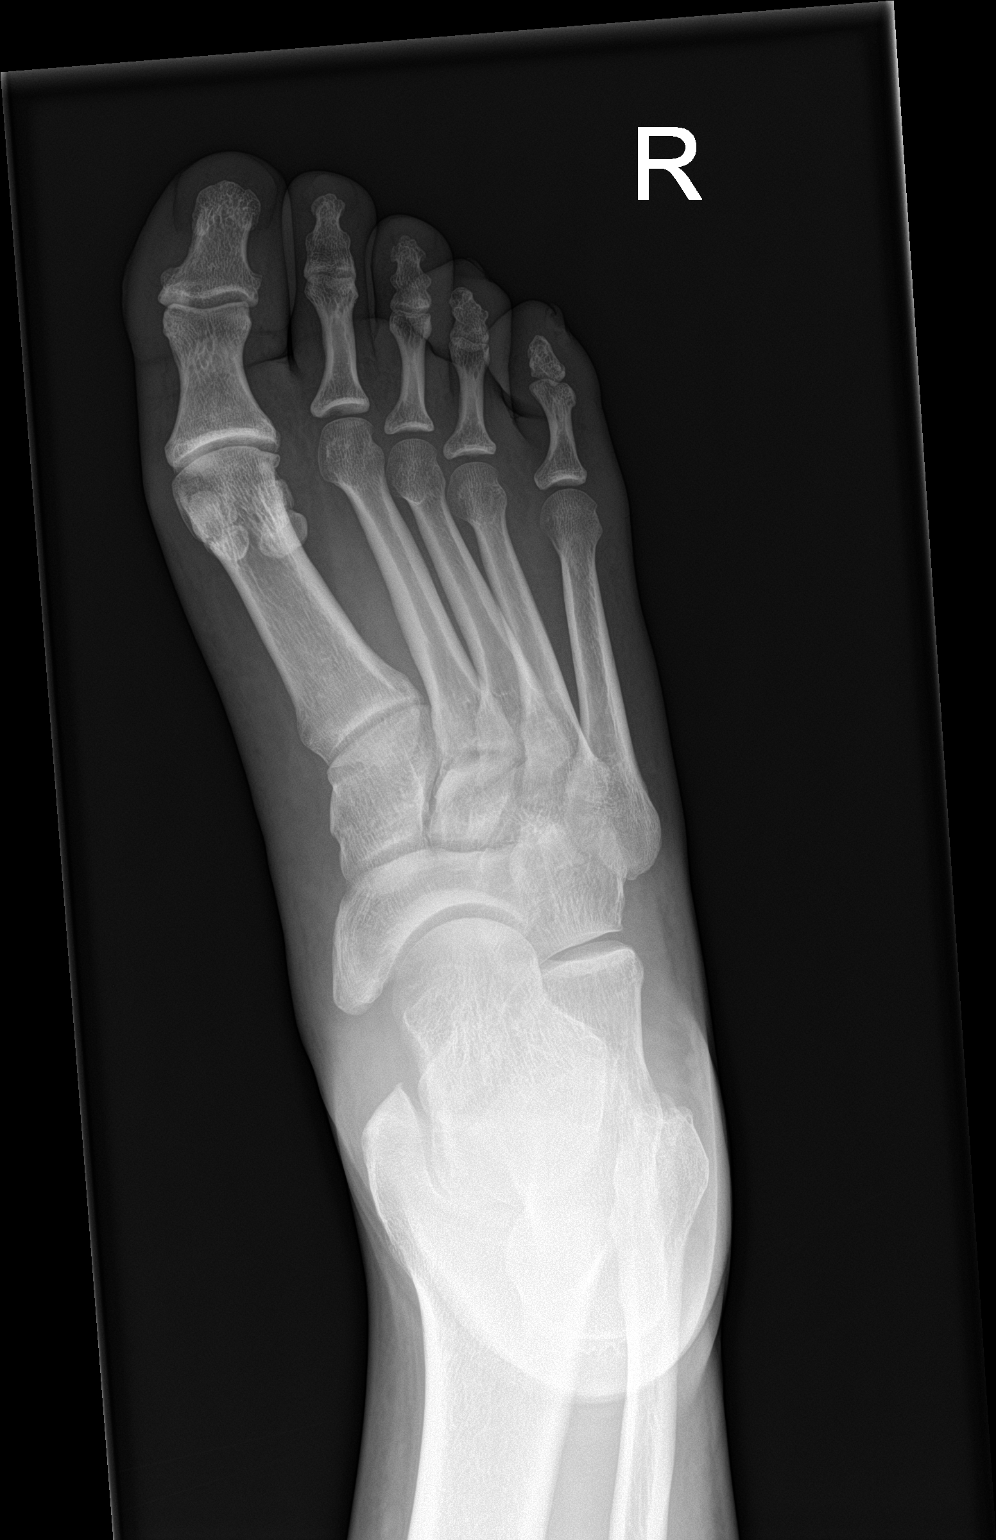

[foot obl]
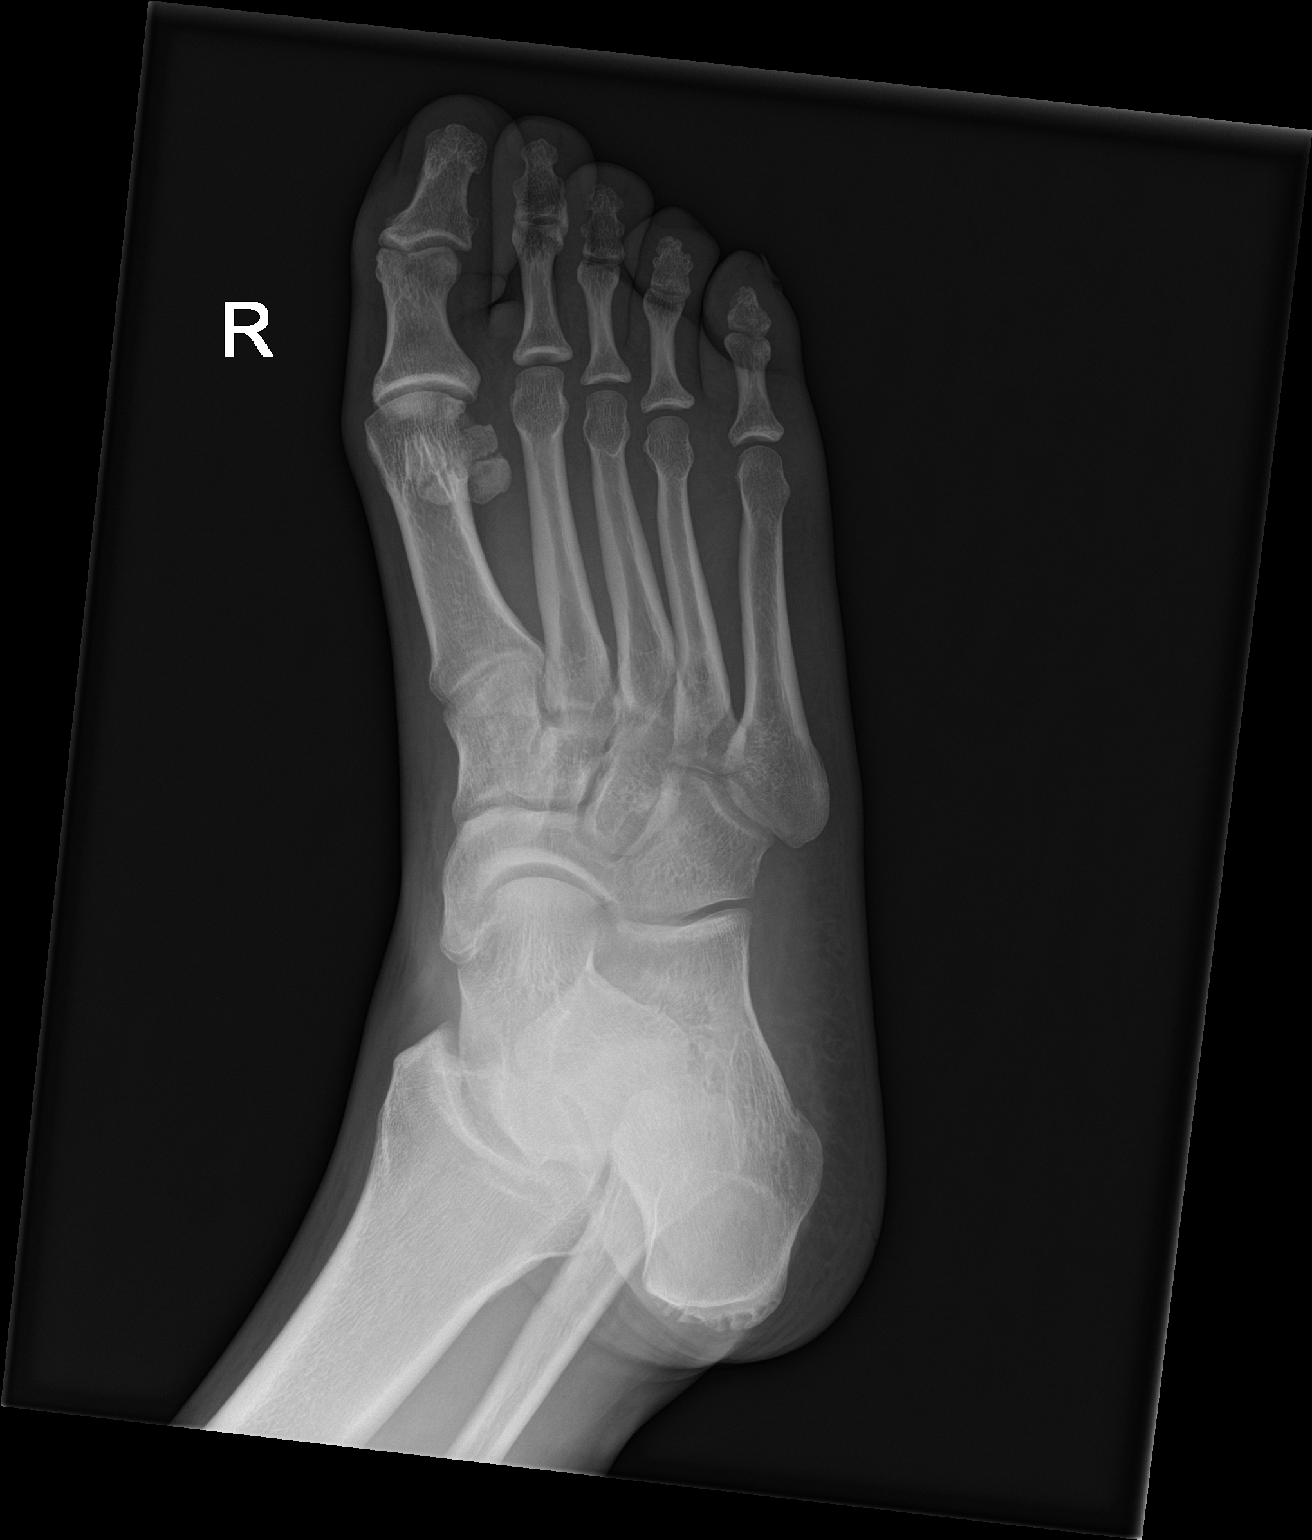

[foot lat]
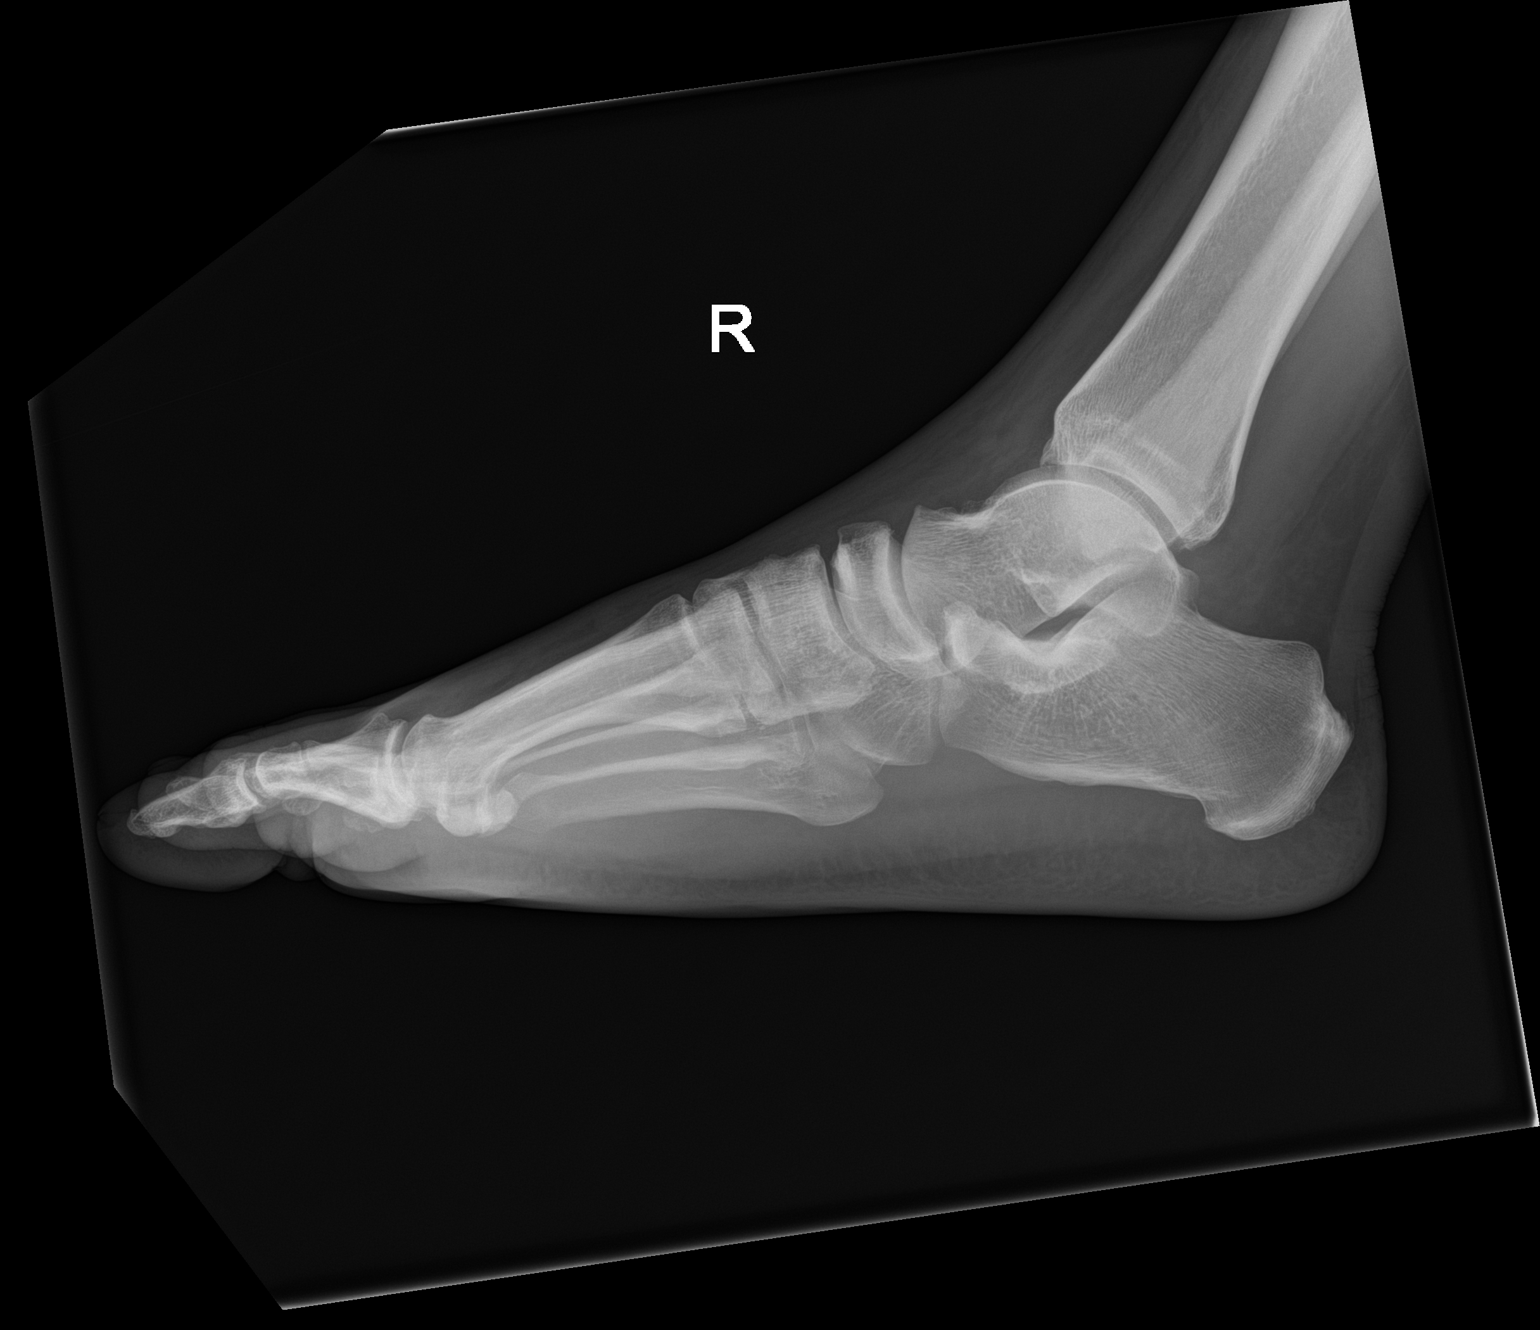

[3 of 3 positions shown; findings below may reference images not displayed]

FINDINGS: There is no evidence of fracture, subluxation or dislocation.

No radiographic evidence of osteomyelitis noted.

No radiopaque foreign bodies are present.

No focal bony lesions are identified.
IMPRESSION: No evidence of bony abnormality.
